# Patient Record
Sex: Female | Born: 1975 | Race: White | Hispanic: No | Marital: Married | Smoking: Never smoker
Health system: Southern US, Community
[De-identification: ages and names within clinical notes are randomized; demographics above are authoritative.]

## PROBLEM LIST (undated history)

## (undated) DIAGNOSIS — D696 Thrombocytopenia, unspecified: Secondary | ICD-10-CM

## (undated) DIAGNOSIS — R911 Solitary pulmonary nodule: Secondary | ICD-10-CM

## (undated) DIAGNOSIS — F419 Anxiety disorder, unspecified: Secondary | ICD-10-CM

## (undated) DIAGNOSIS — Z8616 Personal history of COVID-19: Secondary | ICD-10-CM

## (undated) DIAGNOSIS — F329 Major depressive disorder, single episode, unspecified: Secondary | ICD-10-CM

## (undated) DIAGNOSIS — J301 Allergic rhinitis due to pollen: Secondary | ICD-10-CM

## (undated) DIAGNOSIS — I4719 Other supraventricular tachycardia: Secondary | ICD-10-CM

## (undated) DIAGNOSIS — J45909 Unspecified asthma, uncomplicated: Secondary | ICD-10-CM

## (undated) DIAGNOSIS — F32A Depression, unspecified: Secondary | ICD-10-CM

## (undated) DIAGNOSIS — L309 Dermatitis, unspecified: Secondary | ICD-10-CM

## (undated) DIAGNOSIS — I471 Supraventricular tachycardia: Secondary | ICD-10-CM

## (undated) HISTORY — DX: Allergic rhinitis due to pollen: J30.1

## (undated) HISTORY — DX: Unspecified asthma, uncomplicated: J45.909

## (undated) HISTORY — DX: Depression, unspecified: F32.A

## (undated) HISTORY — DX: Supraventricular tachycardia: I47.1

## (undated) HISTORY — DX: Thrombocytopenia, unspecified: D69.6

## (undated) HISTORY — DX: Personal history of COVID-19: Z86.16

## (undated) HISTORY — PX: NO PAST SURGERIES: SHX2092

## (undated) HISTORY — DX: Dermatitis, unspecified: L30.9

## (undated) HISTORY — DX: Other supraventricular tachycardia: I47.19

## (undated) HISTORY — DX: Solitary pulmonary nodule: R91.1

---
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
## 1998-07-15 ENCOUNTER — Other Ambulatory Visit: Admission: RE | Admit: 1998-07-15 | Discharge: 1998-07-15 | Payer: Self-pay | Admitting: *Deleted

## 1999-08-26 ENCOUNTER — Other Ambulatory Visit: Admission: RE | Admit: 1999-08-26 | Discharge: 1999-08-26 | Payer: Self-pay | Admitting: Emergency Medicine

## 2000-10-10 ENCOUNTER — Other Ambulatory Visit: Admission: RE | Admit: 2000-10-10 | Discharge: 2000-10-10 | Payer: Self-pay | Admitting: Obstetrics and Gynecology

## 2001-10-09 ENCOUNTER — Other Ambulatory Visit: Admission: RE | Admit: 2001-10-09 | Discharge: 2001-10-09 | Payer: Self-pay | Admitting: Obstetrics and Gynecology

## 2002-10-16 ENCOUNTER — Other Ambulatory Visit: Admission: RE | Admit: 2002-10-16 | Discharge: 2002-10-16 | Payer: Self-pay | Admitting: Obstetrics and Gynecology

## 2002-10-19 ENCOUNTER — Encounter: Admission: RE | Admit: 2002-10-19 | Discharge: 2002-10-19 | Payer: Self-pay | Admitting: Obstetrics and Gynecology

## 2002-10-19 ENCOUNTER — Encounter: Payer: Self-pay | Admitting: Obstetrics and Gynecology

## 2004-02-12 ENCOUNTER — Other Ambulatory Visit: Admission: RE | Admit: 2004-02-12 | Discharge: 2004-02-12 | Payer: Self-pay | Admitting: Obstetrics and Gynecology

## 2004-02-26 ENCOUNTER — Ambulatory Visit: Payer: Self-pay | Admitting: Licensed Clinical Social Worker

## 2004-03-11 ENCOUNTER — Ambulatory Visit: Payer: Self-pay | Admitting: Licensed Clinical Social Worker

## 2004-03-18 ENCOUNTER — Ambulatory Visit: Payer: Self-pay | Admitting: Licensed Clinical Social Worker

## 2004-03-25 ENCOUNTER — Ambulatory Visit: Payer: Self-pay | Admitting: Licensed Clinical Social Worker

## 2004-03-30 ENCOUNTER — Ambulatory Visit: Payer: Self-pay | Admitting: Licensed Clinical Social Worker

## 2004-04-09 ENCOUNTER — Ambulatory Visit: Payer: Self-pay | Admitting: Licensed Clinical Social Worker

## 2005-04-07 ENCOUNTER — Other Ambulatory Visit: Admission: RE | Admit: 2005-04-07 | Discharge: 2005-04-07 | Payer: Self-pay | Admitting: Obstetrics and Gynecology

## 2006-07-22 ENCOUNTER — Inpatient Hospital Stay (HOSPITAL_COMMUNITY): Admission: AD | Admit: 2006-07-22 | Discharge: 2006-07-23 | Payer: Self-pay | Admitting: Obstetrics and Gynecology

## 2006-12-30 ENCOUNTER — Inpatient Hospital Stay (HOSPITAL_COMMUNITY): Admission: AD | Admit: 2006-12-30 | Discharge: 2006-12-30 | Payer: Self-pay | Admitting: Obstetrics and Gynecology

## 2007-01-03 ENCOUNTER — Inpatient Hospital Stay (HOSPITAL_COMMUNITY): Admission: AD | Admit: 2007-01-03 | Discharge: 2007-01-05 | Payer: Self-pay | Admitting: Obstetrics and Gynecology

## 2008-12-03 ENCOUNTER — Ambulatory Visit: Payer: Self-pay | Admitting: Oncology

## 2008-12-10 LAB — COMPREHENSIVE METABOLIC PANEL
ALT: 13 U/L (ref 0–35)
AST: 10 U/L (ref 0–37)
Albumin: 4.1 g/dL (ref 3.5–5.2)
Alkaline Phosphatase: 37 U/L — ABNORMAL LOW (ref 39–117)
BUN: 9 mg/dL (ref 6–23)
CO2: 23 mEq/L (ref 19–32)
Calcium: 9.1 mg/dL (ref 8.4–10.5)
Chloride: 106 mEq/L (ref 96–112)
Creatinine, Ser: 0.82 mg/dL (ref 0.40–1.20)
Glucose, Bld: 104 mg/dL — ABNORMAL HIGH (ref 70–99)
Potassium: 4 mEq/L (ref 3.5–5.3)
Sodium: 137 mEq/L (ref 135–145)
Total Bilirubin: 0.3 mg/dL (ref 0.3–1.2)
Total Protein: 6.6 g/dL (ref 6.0–8.3)

## 2008-12-10 LAB — CBC WITH DIFFERENTIAL/PLATELET
BASO%: 0.3 % (ref 0.0–2.0)
Basophils Absolute: 0 10*3/uL (ref 0.0–0.1)
EOS%: 0.6 % (ref 0.0–7.0)
Eosinophils Absolute: 0.1 10*3/uL (ref 0.0–0.5)
HCT: 36.5 % (ref 34.8–46.6)
HGB: 12.5 g/dL (ref 11.6–15.9)
LYMPH%: 16.7 % (ref 14.0–49.7)
MCH: 31.3 pg (ref 25.1–34.0)
MCHC: 34.3 g/dL (ref 31.5–36.0)
MCV: 91.2 fL (ref 79.5–101.0)
MONO#: 0.4 10*3/uL (ref 0.1–0.9)
MONO%: 3.9 % (ref 0.0–14.0)
NEUT#: 7.4 10*3/uL — ABNORMAL HIGH (ref 1.5–6.5)
NEUT%: 78.5 % — ABNORMAL HIGH (ref 38.4–76.8)
Platelets: 158 10*3/uL (ref 145–400)
RBC: 4 10*6/uL (ref 3.70–5.45)
RDW: 12.6 % (ref 11.2–14.5)
WBC: 9.4 10*3/uL (ref 3.9–10.3)
lymph#: 1.6 10*3/uL (ref 0.9–3.3)

## 2008-12-10 LAB — CHCC SMEAR

## 2008-12-10 LAB — LACTATE DEHYDROGENASE: LDH: 107 U/L (ref 94–250)

## 2009-07-27 ENCOUNTER — Inpatient Hospital Stay (HOSPITAL_COMMUNITY): Admission: AD | Admit: 2009-07-27 | Discharge: 2009-07-29 | Payer: Self-pay | Admitting: Obstetrics and Gynecology

## 2010-06-30 LAB — CBC
HCT: 30.5 % — ABNORMAL LOW (ref 36.0–46.0)
HCT: 39.2 % (ref 36.0–46.0)
Hemoglobin: 10.7 g/dL — ABNORMAL LOW (ref 12.0–15.0)
Hemoglobin: 13.4 g/dL (ref 12.0–15.0)
MCHC: 34.3 g/dL (ref 30.0–36.0)
MCHC: 35 g/dL (ref 30.0–36.0)
MCV: 96.4 fL (ref 78.0–100.0)
MCV: 97.1 fL (ref 78.0–100.0)
Platelets: 134 10*3/uL — ABNORMAL LOW (ref 150–400)
Platelets: 99 10*3/uL — ABNORMAL LOW (ref 150–400)
RBC: 3.15 MIL/uL — ABNORMAL LOW (ref 3.87–5.11)
RBC: 4.06 MIL/uL (ref 3.87–5.11)
RDW: 13.4 % (ref 11.5–15.5)
RDW: 13.4 % (ref 11.5–15.5)
WBC: 12.7 10*3/uL — ABNORMAL HIGH (ref 4.0–10.5)
WBC: 14.7 10*3/uL — ABNORMAL HIGH (ref 4.0–10.5)

## 2010-06-30 LAB — RPR: RPR Ser Ql: NONREACTIVE

## 2010-08-28 NOTE — H&P (Signed)
NAME:  Tracey Peterson, Tracey Peterson NO.:  [id number]   MEDICAL RECORD NO.:  [id number]          PATIENT TYPE:  INP   LOCATION:  9312                          FACILITY:  [facility]   PHYSICIAN:  Juluis Mire, M.D.   DATE OF BIRTH:  October 14, 1975   DATE OF ADMISSION:  07/22/2006  DATE OF DISCHARGE:                              HISTORY & PHYSICAL   The patient is a 35 year old primigravida female, estimated gestational  age of [redacted] weeks.  She was initially seen here at approximately 14 weeks  with bleeding.  Ultrasound revealed a subchorionic hemorrhage.  She  presented back today.  An ultrasound revealed that the subchorionic  hemorrhage has increased in size significantly.  Overall measurements  are approximately 15 x 2 cm.  Fetus is still viable.  There is normal  amniotic fluid.  In view of these findings, Dr. Vincente Poli decided to admit  the patient for observation.  Her blood type is O+.   IN TERMS OF ALLERGIES, SHE IS ALLERGIC TO PENICILLIN.   MEDICATION:  Prenatal vitamins.   For past medical history, family history, social history, please see  prenatal records.   REVIEW OF SYSTEMS:  Is noncontributory.   PHYSICAL EXAMINATION:  The patient is afebrile with stable vital signs.  LUNGS:  Clear.  CARDIOVASCULAR SYSTEM:  Regular rhythm and rate without murmurs or  gallops.  ABDOMINAL EXAM:  Uterus consistent with 16 weeks, mildly tender.  PELVIC:  Is deferred at the present time.  EXTREMITIES:  Trace edema.  NEUROLOGICAL EXAM:  Grossly within normal limits.   IMPRESSION:  Intrauterine pregnancy at 16 weeks with significant  subchorionic hemorrhage.   PLAN:  The patient will be brought in for observation.  Will recheck  fetal heart tones.  If remain stable, will discharge home for follow up  in the office.      Juluis Mire, M.D.  Electronically Signed     JSM/MEDQ  D:  07/22/2006  T:  07/22/2006  Job:  [id number]

## 2010-08-28 NOTE — Discharge Summary (Signed)
NAME:  Tracey Peterson, Tracey Peterson NO.:  [id number]   MEDICAL RECORD NO.:  [id number]          PATIENT TYPE:  INP   LOCATION:  9312                          FACILITY:  [facility]   PHYSICIAN:  Juluis Mire, M.D.   DATE OF BIRTH:  08-25-1975   DATE OF ADMISSION:  07/22/2006  DATE OF DISCHARGE:  07/23/2006                               DISCHARGE SUMMARY   ADMITTING DIAGNOSIS:  Intrauterine pregnancy at 16 weeks with  subchorionic hemorrhage.   DISCHARGE DIAGNOSIS:  Intrauterine pregnancy at 16 weeks with  subchorionic hemorrhage.   For complete history and physical please refer to the dictated note.   HOSPITAL COURSE:  The patient was observed overnight.  The following  morning she was having no active bleeding. Uterus nontender, fetal heart  tones were audible.  She was discharged home to continue rest at home.   In terms of complication there were none during her stay in the  hospital.  Patient was discharged home in stable condition.   DISPOSITION:  The patient is to report increase in bleeding or pain at  home. She is to follow-up in the office early next week. She is to be at  relative rest at home.      Juluis Mire, M.D.  Electronically Signed     JSM/MEDQ  D:  07/23/2006  T:  07/23/2006  Job:  [id number]

## 2011-01-21 LAB — CBC
HCT: 29.2 — ABNORMAL LOW
HCT: 37.4
Hemoglobin: 10.3 — ABNORMAL LOW
Hemoglobin: 13.1
MCHC: 35
MCHC: 35.3
MCV: 95.2
MCV: 95.6
Platelets: 106 — ABNORMAL LOW
Platelets: 132 — ABNORMAL LOW
RBC: 3.06 — ABNORMAL LOW
RBC: 3.93
RDW: 12.9
RDW: 13
WBC: 11.9 — ABNORMAL HIGH
WBC: 16.9 — ABNORMAL HIGH

## 2011-01-21 LAB — RPR: RPR Ser Ql: NONREACTIVE

## 2011-01-21 LAB — URINALYSIS, ROUTINE W REFLEX MICROSCOPIC
Bilirubin Urine: NEGATIVE
Glucose, UA: NEGATIVE
Ketones, ur: NEGATIVE
Nitrite: NEGATIVE
Protein, ur: NEGATIVE
Specific Gravity, Urine: 1.015
Urobilinogen, UA: 0.2
pH: 6

## 2011-01-21 LAB — CCBB MATERNAL DONOR DRAW

## 2011-01-21 LAB — URINE CULTURE: Colony Count: 85000

## 2011-01-21 LAB — URINE MICROSCOPIC-ADD ON

## 2011-01-21 LAB — RAPID HIV SCREEN (WH-MAU): Rapid HIV Screen: NONREACTIVE

## 2012-10-03 ENCOUNTER — Emergency Department (HOSPITAL_COMMUNITY): Payer: BC Managed Care – PPO

## 2012-10-03 ENCOUNTER — Emergency Department (HOSPITAL_COMMUNITY): Payer: BC Managed Care – PPO | Admitting: Anesthesiology

## 2012-10-03 ENCOUNTER — Encounter (HOSPITAL_COMMUNITY)
Admission: EM | Disposition: A | Discharge status: Home / Self Care | Payer: Self-pay | Source: Home / Self Care | Attending: Emergency Medicine

## 2012-10-03 ENCOUNTER — Encounter (HOSPITAL_COMMUNITY): Payer: Self-pay | Admitting: Anesthesiology

## 2012-10-03 ENCOUNTER — Observation Stay (HOSPITAL_COMMUNITY)
Admission: EM | Admit: 2012-10-03 | Discharge: 2012-10-05 | Disposition: A | Discharge status: Home / Self Care | Payer: BC Managed Care – PPO | Attending: General Surgery | Admitting: General Surgery

## 2012-10-03 ENCOUNTER — Encounter (HOSPITAL_COMMUNITY): Payer: Self-pay | Admitting: Emergency Medicine

## 2012-10-03 ENCOUNTER — Inpatient Hospital Stay: Admit: 2012-10-03 | Payer: Self-pay | Admitting: Surgery

## 2012-10-03 DIAGNOSIS — R42 Dizziness and giddiness: Secondary | ICD-10-CM | POA: Insufficient documentation

## 2012-10-03 DIAGNOSIS — R110 Nausea: Secondary | ICD-10-CM | POA: Insufficient documentation

## 2012-10-03 DIAGNOSIS — K37 Unspecified appendicitis: Secondary | ICD-10-CM | POA: Diagnosis present

## 2012-10-03 DIAGNOSIS — F411 Generalized anxiety disorder: Secondary | ICD-10-CM | POA: Insufficient documentation

## 2012-10-03 DIAGNOSIS — K429 Umbilical hernia without obstruction or gangrene: Secondary | ICD-10-CM | POA: Insufficient documentation

## 2012-10-03 DIAGNOSIS — K7689 Other specified diseases of liver: Secondary | ICD-10-CM | POA: Insufficient documentation

## 2012-10-03 DIAGNOSIS — K3580 Unspecified acute appendicitis: Secondary | ICD-10-CM

## 2012-10-03 DIAGNOSIS — R109 Unspecified abdominal pain: Secondary | ICD-10-CM | POA: Insufficient documentation

## 2012-10-03 DIAGNOSIS — Z79899 Other long term (current) drug therapy: Secondary | ICD-10-CM | POA: Insufficient documentation

## 2012-10-03 DIAGNOSIS — T50995A Adverse effect of other drugs, medicaments and biological substances, initial encounter: Secondary | ICD-10-CM | POA: Insufficient documentation

## 2012-10-03 HISTORY — DX: Anxiety disorder, unspecified: F41.9

## 2012-10-03 HISTORY — PX: LAPAROSCOPIC APPENDECTOMY: SHX408

## 2012-10-03 LAB — CBC WITH DIFFERENTIAL/PLATELET
Basophils Absolute: 0 10*3/uL (ref 0.0–0.1)
Basophils Relative: 0 % (ref 0–1)
Eosinophils Absolute: 0.1 10*3/uL (ref 0.0–0.7)
Eosinophils Relative: 0 % (ref 0–5)
HCT: 42.2 % (ref 36.0–46.0)
Hemoglobin: 14.2 g/dL (ref 12.0–15.0)
Lymphocytes Relative: 9 % — ABNORMAL LOW (ref 12–46)
Lymphs Abs: 1.5 10*3/uL (ref 0.7–4.0)
MCH: 30.7 pg (ref 26.0–34.0)
MCHC: 33.6 g/dL (ref 30.0–36.0)
MCV: 91.3 fL (ref 78.0–100.0)
Monocytes Absolute: 0.5 10*3/uL (ref 0.1–1.0)
Monocytes Relative: 3 % (ref 3–12)
Neutro Abs: 13.9 10*3/uL — ABNORMAL HIGH (ref 1.7–7.7)
Neutrophils Relative %: 87 % — ABNORMAL HIGH (ref 43–77)
Platelets: 168 10*3/uL (ref 150–400)
RBC: 4.62 MIL/uL (ref 3.87–5.11)
RDW: 12.3 % (ref 11.5–15.5)
WBC: 15.9 10*3/uL — ABNORMAL HIGH (ref 4.0–10.5)

## 2012-10-03 LAB — URINALYSIS, ROUTINE W REFLEX MICROSCOPIC
Bilirubin Urine: NEGATIVE
Glucose, UA: NEGATIVE mg/dL
Hgb urine dipstick: NEGATIVE
Ketones, ur: NEGATIVE mg/dL
Leukocytes, UA: NEGATIVE
Nitrite: NEGATIVE
Protein, ur: NEGATIVE mg/dL
Specific Gravity, Urine: 1.01 (ref 1.005–1.030)
Urobilinogen, UA: 0.2 mg/dL (ref 0.0–1.0)
pH: 6.5 (ref 5.0–8.0)

## 2012-10-03 LAB — BASIC METABOLIC PANEL
BUN: 9 mg/dL (ref 6–23)
CO2: 27 mEq/L (ref 19–32)
Calcium: 9.6 mg/dL (ref 8.4–10.5)
Chloride: 104 mEq/L (ref 96–112)
Creatinine, Ser: 0.92 mg/dL (ref 0.50–1.10)
GFR calc Af Amer: >90 mL/min (ref >90)
GFR calc non Af Amer: 80 mL/min — ABNORMAL LOW (ref >90)
Glucose, Bld: 101 mg/dL — ABNORMAL HIGH (ref 70–99)
Potassium: 3.8 mEq/L (ref 3.5–5.1)
Sodium: 141 mEq/L (ref 135–145)

## 2012-10-03 LAB — PREGNANCY, URINE: Preg Test, Ur: NEGATIVE

## 2012-10-03 SURGERY — APPENDECTOMY, LAPAROSCOPIC
Anesthesia: General | Site: Abdomen | Wound class: Clean Contaminated

## 2012-10-03 MED ORDER — NEOSTIGMINE METHYLSULFATE 1 MG/ML IJ SOLN
INTRAMUSCULAR | Status: DC | PRN
  Administered 2012-10-03: 3 mg via INTRAVENOUS

## 2012-10-03 MED ORDER — LEVOCETIRIZINE DIHYDROCHLORIDE 5 MG PO TABS: 5.0000 mg | ORAL_TABLET | Freq: Every evening | ORAL | Status: DC

## 2012-10-03 MED ORDER — METRONIDAZOLE IN NACL 5-0.79 MG/ML-% IV SOLN
INTRAVENOUS | Status: AC
  Filled 2012-10-03: qty 100

## 2012-10-03 MED ORDER — ONDANSETRON HCL 4 MG/2ML IJ SOLN
4.0000 mg | Freq: Four times a day (QID) | INTRAMUSCULAR | Status: DC | PRN
  Administered 2012-10-03: 4 mg via INTRAVENOUS
  Filled 2012-10-03: qty 2

## 2012-10-03 MED ORDER — MORPHINE SULFATE 2 MG/ML IJ SOLN
1.0000 mg | INTRAMUSCULAR | Status: DC | PRN
  Administered 2012-10-04 (×3): 1 mg via INTRAVENOUS
  Filled 2012-10-03 (×3): qty 1

## 2012-10-03 MED ORDER — FENTANYL CITRATE 0.05 MG/ML IJ SOLN
INTRAMUSCULAR | Status: DC | PRN
  Administered 2012-10-03 (×2): 50 ug via INTRAVENOUS

## 2012-10-03 MED ORDER — PROMETHAZINE HCL 25 MG/ML IJ SOLN: 6.2500 mg | INTRAMUSCULAR | Status: DC | PRN

## 2012-10-03 MED ORDER — HEPARIN SODIUM (PORCINE) 5000 UNIT/ML IJ SOLN
5000.0000 [IU] | Freq: Three times a day (TID) | INTRAMUSCULAR | Status: DC
  Administered 2012-10-04 – 2012-10-05 (×4): 5000 [IU] via SUBCUTANEOUS
  Filled 2012-10-03 (×7): qty 1

## 2012-10-03 MED ORDER — DESOGESTREL-ETHINYL ESTRADIOL 0.15-0.02/0.01 MG (21/5) PO TABS
1.0000 | ORAL_TABLET | Freq: Every day | ORAL | Status: DC
  Administered 2012-10-05: 1 via ORAL

## 2012-10-03 MED ORDER — MORPHINE SULFATE 4 MG/ML IJ SOLN
4.0000 mg | INTRAMUSCULAR | Status: DC | PRN
  Administered 2012-10-03: 4 mg via INTRAVENOUS
  Filled 2012-10-03: qty 1

## 2012-10-03 MED ORDER — CEFOTETAN DISODIUM 1 G IJ SOLR
1.0000 g | Freq: Two times a day (BID) | INTRAMUSCULAR | Status: DC
Start: 2012-10-03 — End: 2012-10-03

## 2012-10-03 MED ORDER — MIDAZOLAM HCL 5 MG/5ML IJ SOLN
INTRAMUSCULAR | Status: DC | PRN
  Administered 2012-10-03: 2 mg via INTRAVENOUS

## 2012-10-03 MED ORDER — KCL IN DEXTROSE-NACL 20-5-0.45 MEQ/L-%-% IV SOLN
INTRAVENOUS | Status: DC
  Administered 2012-10-03 – 2012-10-04 (×2): via INTRAVENOUS
  Filled 2012-10-03 (×4): qty 1000

## 2012-10-03 MED ORDER — METRONIDAZOLE IN NACL 5-0.79 MG/ML-% IV SOLN
500.0000 mg | INTRAVENOUS | Status: AC
  Administered 2012-10-03: 500 mg via INTRAVENOUS

## 2012-10-03 MED ORDER — ONDANSETRON HCL 4 MG/2ML IJ SOLN
4.0000 mg | Freq: Four times a day (QID) | INTRAMUSCULAR | Status: DC | PRN
  Administered 2012-10-04 – 2012-10-05 (×3): 4 mg via INTRAVENOUS
  Filled 2012-10-03 (×3): qty 2

## 2012-10-03 MED ORDER — MONTELUKAST SODIUM 10 MG PO TABS
10.0000 mg | ORAL_TABLET | Freq: Every day | ORAL | Status: DC
  Administered 2012-10-04: 10 mg via ORAL
  Filled 2012-10-03 (×3): qty 1

## 2012-10-03 MED ORDER — CEFAZOLIN SODIUM-DEXTROSE 2-3 GM-% IV SOLR
INTRAVENOUS | Status: AC
  Filled 2012-10-03: qty 50

## 2012-10-03 MED ORDER — DEXAMETHASONE SODIUM PHOSPHATE 10 MG/ML IJ SOLN
INTRAMUSCULAR | Status: DC | PRN
  Administered 2012-10-03: 10 mg via INTRAVENOUS

## 2012-10-03 MED ORDER — AZELASTINE HCL 0.1 % NA SOLN
1.0000 | Freq: Two times a day (BID) | NASAL | Status: DC
  Administered 2012-10-04 – 2012-10-05 (×2): 1 via NASAL
  Filled 2012-10-03: qty 30

## 2012-10-03 MED ORDER — HYDROMORPHONE HCL PF 1 MG/ML IJ SOLN: 0.2500 mg | INTRAMUSCULAR | Status: DC | PRN

## 2012-10-03 MED ORDER — ACETAMINOPHEN 10 MG/ML IV SOLN: 1000.0000 mg | Freq: Once | INTRAVENOUS | Status: DC | PRN

## 2012-10-03 MED ORDER — SODIUM CHLORIDE 0.9 % IV SOLN: Freq: Once | INTRAVENOUS | Status: DC

## 2012-10-03 MED ORDER — SODIUM CHLORIDE 0.9 % IV BOLUS (SEPSIS)
1000.0000 mL | Freq: Once | INTRAVENOUS | Status: AC
  Administered 2012-10-03: 1000 mL via INTRAVENOUS

## 2012-10-03 MED ORDER — BUPIVACAINE-EPINEPHRINE PF 0.25-1:200000 % IJ SOLN
INTRAMUSCULAR | Status: AC
  Filled 2012-10-03: qty 30

## 2012-10-03 MED ORDER — ONDANSETRON HCL 4 MG PO TABS: 4.0000 mg | ORAL_TABLET | Freq: Four times a day (QID) | ORAL | Status: DC | PRN

## 2012-10-03 MED ORDER — MEPERIDINE HCL 50 MG/ML IJ SOLN: 6.2500 mg | INTRAMUSCULAR | Status: DC | PRN

## 2012-10-03 MED ORDER — OXYCODONE HCL 5 MG/5ML PO SOLN
5.0000 mg | Freq: Once | ORAL | Status: DC | PRN
  Filled 2012-10-03: qty 5

## 2012-10-03 MED ORDER — CEFAZOLIN SODIUM-DEXTROSE 2-3 GM-% IV SOLR
2.0000 g | INTRAVENOUS | Status: AC
  Administered 2012-10-03: 2 g via INTRAVENOUS
  Filled 2012-10-03: qty 50

## 2012-10-03 MED ORDER — GLYCOPYRROLATE 0.2 MG/ML IJ SOLN
INTRAMUSCULAR | Status: DC | PRN
  Administered 2012-10-03: 0.4 mg via INTRAVENOUS

## 2012-10-03 MED ORDER — DIPHENHYDRAMINE HCL 50 MG/ML IJ SOLN: 25.0000 mg | Freq: Once | INTRAMUSCULAR | Status: DC

## 2012-10-03 MED ORDER — LACTATED RINGERS IV SOLN
INTRAVENOUS | Status: DC | PRN
  Administered 2012-10-03: 20:00:00 via INTRAVENOUS

## 2012-10-03 MED ORDER — ACETAMINOPHEN 325 MG PO TABS: 650.0000 mg | ORAL_TABLET | ORAL | Status: DC | PRN

## 2012-10-03 MED ORDER — IOHEXOL 300 MG/ML  SOLN
80.0000 mL | Freq: Once | INTRAMUSCULAR | Status: AC | PRN
  Administered 2012-10-03: 80 mL via INTRAVENOUS

## 2012-10-03 MED ORDER — CISATRACURIUM BESYLATE (PF) 10 MG/5ML IV SOLN
INTRAVENOUS | Status: DC | PRN
  Administered 2012-10-03: 4 mg via INTRAVENOUS

## 2012-10-03 MED ORDER — FLUTICASONE PROPIONATE 50 MCG/ACT NA SUSP
2.0000 | Freq: Every day | NASAL | Status: DC
  Administered 2012-10-05: 2 via NASAL
  Filled 2012-10-03: qty 16

## 2012-10-03 MED ORDER — CETIRIZINE HCL 10 MG PO TABS
10.0000 mg | ORAL_TABLET | Freq: Every day | ORAL | Status: DC
  Filled 2012-10-03 (×3): qty 1

## 2012-10-03 MED ORDER — BUPIVACAINE-EPINEPHRINE 0.25% -1:200000 IJ SOLN
INTRAMUSCULAR | Status: DC | PRN
  Administered 2012-10-03: 10 mL

## 2012-10-03 MED ORDER — ONDANSETRON HCL 4 MG/2ML IJ SOLN
INTRAMUSCULAR | Status: DC | PRN
  Administered 2012-10-03: 4 mg via INTRAVENOUS

## 2012-10-03 MED ORDER — LACTATED RINGERS IV SOLN
INTRAVENOUS | Status: DC | PRN
  Administered 2012-10-03: 250 mL via INTRAVENOUS
  Administered 2012-10-03: 22:00:00

## 2012-10-03 MED ORDER — HYDROCODONE-ACETAMINOPHEN 5-325 MG PO TABS
1.0000 | ORAL_TABLET | ORAL | Status: DC | PRN
  Administered 2012-10-03 – 2012-10-04 (×5): 2 via ORAL
  Filled 2012-10-03 (×5): qty 2

## 2012-10-03 MED ORDER — SUCCINYLCHOLINE CHLORIDE 20 MG/ML IJ SOLN
INTRAMUSCULAR | Status: DC | PRN
  Administered 2012-10-03: 80 mg via INTRAVENOUS

## 2012-10-03 MED ORDER — LACTATED RINGERS IV SOLN: INTRAVENOUS | Status: DC

## 2012-10-03 MED ORDER — MORPHINE SULFATE 2 MG/ML IJ SOLN
INTRAMUSCULAR | Status: AC
  Administered 2012-10-03: 1 mg via INTRAVENOUS
  Filled 2012-10-03: qty 1

## 2012-10-03 MED ORDER — PROPOFOL 10 MG/ML IV BOLUS
INTRAVENOUS | Status: DC | PRN
  Administered 2012-10-03: 20 mg via INTRAVENOUS
  Administered 2012-10-03: 130 mg via INTRAVENOUS

## 2012-10-03 MED ORDER — OXYCODONE HCL 5 MG PO TABS: 5.0000 mg | ORAL_TABLET | Freq: Once | ORAL | Status: DC | PRN

## 2012-10-03 MED ORDER — IOHEXOL 300 MG/ML  SOLN
50.0000 mL | Freq: Once | INTRAMUSCULAR | Status: AC | PRN
  Administered 2012-10-03: 50 mL via ORAL

## 2012-10-03 SURGICAL SUPPLY — 38 items
APPLIER CLIP ROT 10 11.4 M/L (STAPLE)
BENZOIN TINCTURE PRP APPL 2/3 (GAUZE/BANDAGES/DRESSINGS) ×2 IMPLANT
CANISTER SUCTION 2500CC (MISCELLANEOUS) ×2 IMPLANT
CLIP APPLIE ROT 10 11.4 M/L (STAPLE) IMPLANT
CLOTH BEACON ORANGE TIMEOUT ST (SAFETY) ×2 IMPLANT
COVER SURGICAL LIGHT HANDLE (MISCELLANEOUS) ×2 IMPLANT
CUTTER FLEX LINEAR 45M (STAPLE) ×2 IMPLANT
DECANTER SPIKE VIAL GLASS SM (MISCELLANEOUS) ×2 IMPLANT
DERMABOND ADVANCED (GAUZE/BANDAGES/DRESSINGS) ×1
DERMABOND ADVANCED .7 DNX12 (GAUZE/BANDAGES/DRESSINGS) ×1 IMPLANT
DRAPE LAPAROSCOPIC ABDOMINAL (DRAPES) ×2 IMPLANT
ELECT REM PT RETURN 9FT ADLT (ELECTROSURGICAL) ×2
ELECTRODE REM PT RTRN 9FT ADLT (ELECTROSURGICAL) ×1 IMPLANT
ENDOLOOP SUT PDS II  0 18 (SUTURE)
ENDOLOOP SUT PDS II 0 18 (SUTURE) IMPLANT
GLOVE BIOGEL M 8.0 STRL (GLOVE) ×2 IMPLANT
GLOVE BIOGEL PI IND STRL 7.0 (GLOVE) ×1 IMPLANT
GLOVE BIOGEL PI INDICATOR 7.0 (GLOVE) ×1
GOWN STRL NON-REIN LRG LVL3 (GOWN DISPOSABLE) ×2 IMPLANT
GOWN STRL REIN XL XLG (GOWN DISPOSABLE) ×4 IMPLANT
HAND ACTIVATED (MISCELLANEOUS) ×2 IMPLANT
KIT BASIN OR (CUSTOM PROCEDURE TRAY) ×2 IMPLANT
NS IRRIG 1000ML POUR BTL (IV SOLUTION) ×2 IMPLANT
PENCIL BUTTON HOLSTER BLD 10FT (ELECTRODE) IMPLANT
POUCH SPECIMEN RETRIEVAL 10MM (ENDOMECHANICALS) ×2 IMPLANT
RELOAD 45 VASCULAR/THIN (ENDOMECHANICALS) ×2 IMPLANT
RELOAD STAPLE TA45 3.5 REG BLU (ENDOMECHANICALS) IMPLANT
SET IRRIG TUBING LAPAROSCOPIC (IRRIGATION / IRRIGATOR) ×2 IMPLANT
SOLUTION ANTI FOG 6CC (MISCELLANEOUS) ×2 IMPLANT
STRIP CLOSURE SKIN 1/2X4 (GAUZE/BANDAGES/DRESSINGS) ×2 IMPLANT
SUT VIC AB 4-0 SH 18 (SUTURE) ×2 IMPLANT
SUT VICRYL 0 UR6 27IN ABS (SUTURE) ×2 IMPLANT
SYR 30ML LL (SYRINGE) ×2 IMPLANT
TRAY FOLEY CATH 14FRSI W/METER (CATHETERS) ×2 IMPLANT
TRAY LAP CHOLE (CUSTOM PROCEDURE TRAY) ×2 IMPLANT
TROCAR XCEL BLUNT TIP 100MML (ENDOMECHANICALS) ×2 IMPLANT
TROCAR XCEL NON-BLD 11X100MML (ENDOMECHANICALS) ×2 IMPLANT
TUBING INSUFFLATION 10FT LAP (TUBING) ×2 IMPLANT

## 2012-10-03 NOTE — Anesthesia Preprocedure Evaluation (Addendum)
Anesthesia Evaluation  Patient identified by MRN, date of birth, ID band Patient awake    Reviewed: Allergy & Precautions, H&P , NPO status , Patient's Chart, lab work & pertinent test results  Airway Mallampati: II TM Distance: >3 FB Neck ROM: Full    Dental  (+) Dental Advisory Given and Teeth Intact   Pulmonary neg pulmonary ROS,  breath sounds clear to auscultation        Cardiovascular negative cardio ROS  Rhythm:Regular Rate:Normal     Neuro/Psych PSYCHIATRIC DISORDERS Anxiety negative neurological ROS     GI/Hepatic negative GI ROS, Neg liver ROS,   Endo/Other  negative endocrine ROS  Renal/GU negative Renal ROS     Musculoskeletal negative musculoskeletal ROS (+)   Abdominal   Peds  Hematology negative hematology ROS (+)   Anesthesia Other Findings   Reproductive/Obstetrics negative OB ROS                          Anesthesia Physical Anesthesia Plan  ASA: I and emergent  Anesthesia Plan: General   Post-op Pain Management:    Induction: Intravenous and Rapid sequence  Airway Management Planned: Oral ETT  Additional Equipment:   Intra-op Plan:   Post-operative Plan: Extubation in OR  Informed Consent: I have reviewed the patients History and Physical, chart, labs and discussed the procedure including the risks, benefits and alternatives for the proposed anesthesia with the patient or authorized representative who has indicated his/her understanding and acceptance.   Dental advisory given  Plan Discussed with: CRNA  Anesthesia Plan Comments:         Anesthesia Quick Evaluation

## 2012-10-03 NOTE — Op Note (Signed)
Surgeon: Wenda Low, MD, FACS  Asst:  none  Anes:  Gen.  Procedure: Laparoscopic appendectomy  Diagnosis: Acute appendicitis  Complications: None noted  EBL:   minimal cc  Description of Procedure:  The patient was taken to room 1 and given general anesthesia. She was prepped with PCMX and draped sterilely. She had been given Ancef and Flagyl  Preoperatively.  A timeout was performed. The abdomen was accessed using Hassan technique through the umbilicus without difficulty. She did have a small umbilical hernia at the base of her appendix and this was enlarged to accommodate the [facility] trocar. 5 mm was placed obliquely in the right upper quadrant and 11 was placed in left lower quadrant obliquely through which the camera was placed. Upon surveying the abdomen she did have some blood in the pelvis suggesting possible mittelschmerz. The appendix was retrocecal and was engorged but not purulent. It was mobilized from the retroperitoneum with the harmonic scalpel and the base isolated. The stump was transected with the Endo GIA using a vascular load. The area was surveyed and no bleeding was noted. The incisions were injected with Marcaine. The umbilical defect was repaired under laparoscopic direct vision with 3 sutures of 0 Vicryl. Skin was approximated with 4-0 Vicryl and Dermabond. Patient was taken recovery in satisfactory condition.  Matt B. Daphine Deutscher, MD, [facility], [place] [phone]

## 2012-10-03 NOTE — ED Notes (Signed)
Pt reports lower abdominal since last night, c/o nausea, denies vomiting. Referred by PCP to rule out appendicitis

## 2012-10-03 NOTE — Transfer of Care (Signed)
Immediate Anesthesia Transfer of Care Note  Patient: Tracey Peterson  Procedure(s) Performed: Procedure(s): APPENDECTOMY LAPAROSCOPIC (N/A)  Patient Location: PACU  Anesthesia Type:General  Level of Consciousness: awake, alert , sedated and patient cooperative  Airway & Oxygen Therapy: Patient Spontanous Breathing and Patient connected to face mask oxygen  Post-op Assessment: Report given to PACU RN and Post -op Vital signs reviewed and stable  Post vital signs: Reviewed and stable  Complications: No apparent anesthesia complications

## 2012-10-03 NOTE — ED Provider Notes (Signed)
History    CSN: [id number] Arrival date & time 10/03/12  1649  First MD Initiated Contact with Patient 10/03/12 1658     Chief Complaint  Patient presents with  . Abdominal Pain    seen at [facility]. -R/o appendicitis. Pain x 20 hrs   (Consider location/radiation/quality/duration/timing/severity/associated sxs/prior Treatment) HPI Comments: Pt come in with cc of abd pain. Pt has no medical, surgical hx. States that she started having epigastric pain last night, that became periumbilical. The pain has been constant, and getting worse, so she went to her pcp - who sent her to the ED with concerns for appendicitis based on her exam. Pt has no pelvic disorders, no uti like sx and no vaginal discharge or bleeding. + nausea, anorexia.  Patient is a 37 y.o. female presenting with abdominal pain. The history is provided by the patient.  Abdominal Pain Associated symptoms include abdominal pain. Pertinent negatives include no chest pain, no headaches and no shortness of breath.   Past Medical History  Diagnosis Date  . Anxiety    History reviewed. No pertinent past surgical history. Family History  Problem Relation Age of Onset  . Cancer Mother   . Diabetes Other    History  Substance Use Topics  . Smoking status: Not on file  . Smokeless tobacco: Not on file  . Alcohol Use: Not on file   OB History   Grav Para Term Preterm Abortions TAB SAB Ect Mult Living                 Review of Systems  Constitutional: Positive for activity change.  HENT: Negative for neck pain.   Respiratory: Negative for shortness of breath.   Cardiovascular: Negative for chest pain.  Gastrointestinal: Positive for abdominal pain. Negative for nausea and vomiting.  Genitourinary: Negative for dysuria.  Neurological: Negative for headaches.    Allergies  Penicillins  Home Medications   Current Outpatient Rx  Name  Route  Sig  Dispense  Refill  . ALPRAZolam (XANAX) 0.5 MG tablet   Oral  Take 0.5 mg by mouth 2 (two) times daily as needed for anxiety.         Marland Kitchen azelastine (ASTELIN) 137 MCG/SPRAY nasal spray   Nasal   Place 1 spray into the nose 2 (two) times daily.         Marland Kitchen desogestrel-ethinyl estradiol (KARIVA,AZURETTE,MIRCETTE) 0.15-0.02/0.01 MG (21/5) tablet   Oral   Take 1 tablet by mouth daily.         Marland Kitchen doxycycline (VIBRA-TABS) 100 MG tablet   Oral   Take 100 mg by mouth 2 (two) times daily. 20 day course of therapy started 09/22/12.         . fluticasone (FLONASE) 50 MCG/ACT nasal spray   Nasal   Place 2 sprays into the nose daily.         Marland Kitchen ibuprofen (ADVIL,MOTRIN) 200 MG tablet   Oral   Take 600 mg by mouth every 8 (eight) hours as needed for pain.         Marland Kitchen levocetirizine (XYZAL) 5 MG tablet   Oral   Take 5 mg by mouth every evening.         . montelukast (SINGULAIR) 10 MG tablet   Oral   Take 10 mg by mouth at bedtime.         . Multiple Vitamin (MULTIVITAMIN WITH MINERALS) TABS   Oral   Take 1 tablet by mouth daily.         Marland Kitchen  pyridOXINE (VITAMIN B-6) 50 MG tablet   Oral   Take 50 mg by mouth daily.          BP 124/83  Pulse 65  Temp(Src) 98.4 F (36.9 C) (Oral)  Resp 18  Wt 125 lb (56.7 kg)  SpO2 100%  LMP 09/24/2012 Physical Exam  Nursing note and vitals reviewed. Constitutional: She is oriented to person, place, and time. She appears well-developed and well-nourished.  HENT:  Head: Normocephalic and atraumatic.  Eyes: EOM are normal. Pupils are equal, round, and reactive to light.  Neck: Neck supple.  Cardiovascular: Normal rate, regular rhythm and normal heart sounds.   No murmur heard. Pulmonary/Chest: Effort normal. No respiratory distress.  Abdominal: Soft. She exhibits no distension. There is tenderness. There is guarding. There is no rebound.  + mcburneys  Neurological: She is alert and oriented to person, place, and time.  Skin: Skin is warm and dry.    ED Course  Procedures (including critical  care time) Labs Reviewed  CBC WITH DIFFERENTIAL - Abnormal; Notable for the following:    WBC 15.9 (*)    Neutrophils Relative % 87 (*)    Neutro Abs 13.9 (*)    Lymphocytes Relative 9 (*)    All other components within normal limits  BASIC METABOLIC PANEL - Abnormal; Notable for the following:    Glucose, Bld 101 (*)    GFR calc non Af Amer 80 (*)    All other components within normal limits  PREGNANCY, URINE  URINALYSIS, ROUTINE W REFLEX MICROSCOPIC   No results found. No diagnosis found.  MDM  DDx includes:  SBO Tumors Colitis Intra abdominal abscess Thrombosis Mesenteric ischemia Appendicitis Nephrolithiasis Pyelonephritis UTI/Cystitis Ovarian cyst TOA Ectopic pregnancy PID  Pt comes in with cc of abd pain. Pt's hx and exam concerning for appendicitis. CT ordered - if negative for appy - will get a pelvic exam and possibly [place] as well. The reason Pelvic exam and workup is being deferred as her hx and exam is consistent with text book presentation for appendicitis.   7:09 PM CT confirms appy. Has leukocytosis. Surgery called. Penicillin allergy - not severe, will give cefotetan.          Derwood Kaplan, MD 10/03/12 1910

## 2012-10-03 NOTE — H&P (Signed)
Chief Complaint:  Abdominal pain since last night localizing into the right lower quadrant  History of Present Illness:  Tracey Peterson is an 37 y.o. female who was brought to the [facility] with RLQ pain and diminished appetite.  CT scan is suspicious for appendicitis.    Past Medical History  Diagnosis Date  . Anxiety     Past Surgical History  Procedure Laterality Date  . No past surgeries      Current Facility-Administered Medications  Medication Dose Route Frequency Provider Last Rate Last Dose  . 0.9 %  sodium chloride infusion   Intravenous Once Derwood Kaplan, MD      . ceFAZolin (ANCEF) IVPB 2 g/50 mL premix  2 g Intravenous 30 min Pre-Op Ankit Nanavati, MD      . diphenhydrAMINE (BENADRYL) injection 25 mg  25 mg Intravenous Once Ankit Nanavati, MD      . metroNIDAZOLE (FLAGYL) IVPB 500 mg  500 mg Intravenous 30 min Pre-Op Ankit Nanavati, MD      . morphine 4 MG/ML injection 4 mg  4 mg Intravenous Q2H PRN Derwood Kaplan, MD   4 mg at 10/03/12 1738  . ondansetron (ZOFRAN) injection 4 mg  4 mg Intravenous Q6H PRN Derwood Kaplan, MD   4 mg at 10/03/12 1738   Current Outpatient Prescriptions  Medication Sig Dispense Refill  . ALPRAZolam (XANAX) 0.5 MG tablet Take 0.5 mg by mouth 2 (two) times daily as needed for anxiety.      Marland Kitchen azelastine (ASTELIN) 137 MCG/SPRAY nasal spray Place 1 spray into the nose 2 (two) times daily.      Marland Kitchen desogestrel-ethinyl estradiol (KARIVA,AZURETTE,MIRCETTE) 0.15-0.02/0.01 MG (21/5) tablet Take 1 tablet by mouth daily.      Marland Kitchen doxycycline (VIBRA-TABS) 100 MG tablet Take 100 mg by mouth 2 (two) times daily. 20 day course of therapy started 09/22/12.      . fluticasone (FLONASE) 50 MCG/ACT nasal spray Place 2 sprays into the nose daily.      Marland Kitchen ibuprofen (ADVIL,MOTRIN) 200 MG tablet Take 600 mg by mouth every 8 (eight) hours as needed for pain.      Marland Kitchen levocetirizine (XYZAL) 5 MG tablet Take 5 mg by mouth every evening.      . montelukast (SINGULAIR) 10 MG tablet  Take 10 mg by mouth at bedtime.      . Multiple Vitamin (MULTIVITAMIN WITH MINERALS) TABS Take 1 tablet by mouth daily.      Marland Kitchen pyridOXINE (VITAMIN B-6) 50 MG tablet Take 50 mg by mouth daily.       Penicillins Family History  Problem Relation Age of Onset  . Cancer Mother   . Diabetes Other    Social History:   reports that she has never smoked. She has never used smokeless tobacco. She reports that  drinks alcohol. She reports that she does not use illicit drugs.   REVIEW OF SYSTEMS - PERTINENT POSITIVES ONLY: Allergies treated at [facility] allergy  Physical Exam:   Blood pressure 124/83, pulse 65, temperature 98.4 F (36.9 C), temperature source Oral, resp. rate 18, weight 125 lb (56.7 kg), last menstrual period 09/24/2012, SpO2 100.00%. There is no height on file to calculate BMI.  Gen:  WDWN WF NAD  Neurological: Alert and oriented to person, place, and time. Motor and sensory function is grossly intact  Head: Normocephalic and atraumatic.  Eyes: Conjunctivae are normal. Pupils are equal, round, and reactive to light. No scleral icterus.  Neck: Normal range of motion. Neck  supple. No tracheal deviation or thyromegaly present.  Cardiovascular:  SR without murmurs or gallops.  No carotid bruits Respiratory: Effort normal.  No respiratory distress. No chest wall tenderness. Breath sounds normal.  No wheezes, rales or rhonchi.  Abdomen:  Tender at McBurney's point GU: Musculoskeletal: Normal range of motion. Extremities are nontender. No cyanosis, edema or clubbing noted Lymphadenopathy: No cervical, preauricular, postauricular or axillary adenopathy is present Skin: Skin is warm and dry. No rash noted. No diaphoresis. No erythema. No pallor. Pscyh: Normal mood and affect. Behavior is normal. Judgment and thought content normal.   LABORATORY RESULTS: Results for orders placed during the hospital encounter of 10/03/12 (from the past 48 hour(s))  PREGNANCY, URINE     Status: None    Collection Time    10/03/12  5:19 PM      Result Value Range   Preg Test, Ur NEGATIVE  NEGATIVE   Comment:            THE SENSITIVITY OF THIS     METHODOLOGY IS >20 mIU/mL.  URINALYSIS, ROUTINE W REFLEX MICROSCOPIC     Status: None   Collection Time    10/03/12  5:19 PM      Result Value Range   Color, Urine YELLOW  YELLOW   APPearance CLEAR  CLEAR   Specific Gravity, Urine 1.010  1.005 - 1.030   pH 6.5  5.0 - 8.0   Glucose, UA NEGATIVE  NEGATIVE mg/dL   Hgb urine dipstick NEGATIVE  NEGATIVE   Bilirubin Urine NEGATIVE  NEGATIVE   Ketones, ur NEGATIVE  NEGATIVE mg/dL   Protein, ur NEGATIVE  NEGATIVE mg/dL   Urobilinogen, UA 0.2  0.0 - 1.0 mg/dL   Nitrite NEGATIVE  NEGATIVE   Leukocytes, UA NEGATIVE  NEGATIVE   Comment: MICROSCOPIC NOT DONE ON URINES WITH NEGATIVE PROTEIN, BLOOD, LEUKOCYTES, NITRITE, OR GLUCOSE <1000 mg/dL.  CBC WITH DIFFERENTIAL     Status: Abnormal   Collection Time    10/03/12  5:50 PM      Result Value Range   WBC 15.9 (*) 4.0 - 10.5 K/uL   RBC 4.62  3.87 - 5.11 MIL/uL   Hemoglobin 14.2  12.0 - 15.0 g/dL   HCT 95.6  21.3 - 08.6 %   MCV 91.3  78.0 - 100.0 fL   MCH 30.7  26.0 - 34.0 pg   MCHC 33.6  30.0 - 36.0 g/dL   RDW 57.8  46.9 - 62.9 %   Platelets 168  150 - 400 K/uL   Neutrophils Relative % 87 (*) 43 - 77 %   Neutro Abs 13.9 (*) 1.7 - 7.7 K/uL   Lymphocytes Relative 9 (*) 12 - 46 %   Lymphs Abs 1.5  0.7 - 4.0 K/uL   Monocytes Relative 3  3 - 12 %   Monocytes Absolute 0.5  0.1 - 1.0 K/uL   Eosinophils Relative 0  0 - 5 %   Eosinophils Absolute 0.1  0.0 - 0.7 K/uL   Basophils Relative 0  0 - 1 %   Basophils Absolute 0.0  0.0 - 0.1 K/uL  BASIC METABOLIC PANEL     Status: Abnormal   Collection Time    10/03/12  5:50 PM      Result Value Range   Sodium 141  135 - 145 mEq/L   Potassium 3.8  3.5 - 5.1 mEq/L   Chloride 104  96 - 112 mEq/L   CO2 27  19 - 32 mEq/L  Glucose, Bld 101 (*) 70 - 99 mg/dL   BUN 9  6 - 23 mg/dL   Creatinine, Ser 5.40   0.50 - 1.10 mg/dL   Calcium 9.6  8.4 - 98.1 mg/dL   GFR calc non Af Amer 80 (*) >90 mL/min   GFR calc Af Amer >90  >90 mL/min   Comment:            The eGFR has been calculated     using the CKD EPI equation.     This calculation has not been     validated in all clinical     situations.     eGFR's persistently     <90 mL/min signify     possible Chronic Kidney Disease.    RADIOLOGY RESULTS: Ct Abdomen Pelvis W Contrast  10/03/2012   *RADIOLOGY REPORT*  Clinical Data: Pain right lower quadrant.  Elevated white count. Question appendicitis.  CT ABDOMEN AND PELVIS WITH CONTRAST  Technique:  Multidetector CT imaging of the abdomen and pelvis was performed following the standard protocol during bolus administration of intravenous contrast.  Contrast: 50mL OMNIPAQUE IOHEXOL 300 MG/ML  SOLN, 80mL OMNIPAQUE IOHEXOL 300 MG/ML  SOLN  Comparison: None.  Findings: It is difficult to evaluate structures in the right pelvis secondary to the lack of fat planes, right ovarian cyst and portions of unopacified bowel however, findings are suspicious for appendicitis with appendix measuring up to 10.7 mm containing small appendicoliths.  No well-defined drainable abscess or free intraperitoneal air.  Lung bases clear.  Right liver 1.9 cm cyst without worrisome hepatic lesion. Intrahepatic biliary ducts top normal in size.  Liver is elongated spanning over 21.7 cm.  No calcified gallstones.  No worrisome splenic, pancreatic, renal or adrenal lesion.  No abdominal aortic aneurysm.  Calcified lymph node at the gastroesophageal junction.  Prominent vascularity of the uterus may be an incidental finding and greater on the left.  Similar findings described in patients with pelvic venous congestion syndrome.  Decompressed urinary bladder unremarkable.  No bony destructive lesion.  Transitional vertebra lumbosacral junction.  IMPRESSION: Findings suspicious for appendicitis as discussed above.  Elongated liver with 1.9 cm  cyst.  Prominent vascularity of the uterus may be an incidental finding and greater on the left.  Similar findings described in patients with pelvic venous congestion syndrome.  Critical Value/emergent results were called by telephone at the time of interpretation on 10/03/2012 at 6:50 p.m. to Dr.Nanavati, who verbally acknowledged these results.   Original Report Authenticated By: Lacy Duverney, M.D.    Problem List: Patient Active Problem List   Diagnosis Date Noted  . Appendicitis 10/03/2012    Assessment & Plan: Acute appendicitis by CT and clinically.  I have explained lap and open appendectomy to her and her husband.  Will proceed to OR.      Matt B. Daphine Deutscher, MD, [facility] [phone] beeper [phone]  10/03/2012 7:30 PM

## 2012-10-03 NOTE — Anesthesia Postprocedure Evaluation (Signed)
Anesthesia Post Note  Patient: Tracey Peterson  Procedure(s) Performed: Procedure(s) (LRB): APPENDECTOMY LAPAROSCOPIC (N/A)  Anesthesia type: General  Patient location: PACU  Post pain: Pain level controlled  Post assessment: Post-op Vital signs reviewed  Last Vitals: BP 110/69  Pulse 59  Temp(Src) 36.8 C (Oral)  Resp 13  Wt 125 lb (56.7 kg)  SpO2 100%  LMP 09/24/2012  Post vital signs: Reviewed  Level of consciousness: sedated  Complications: No apparent anesthesia complications

## 2012-10-04 ENCOUNTER — Encounter (HOSPITAL_COMMUNITY): Payer: Self-pay | Admitting: Surgery

## 2012-10-04 LAB — CBC
HCT: 31.8 % — ABNORMAL LOW (ref 36.0–46.0)
Hemoglobin: 10.6 g/dL — ABNORMAL LOW (ref 12.0–15.0)
MCH: 30.3 pg (ref 26.0–34.0)
MCHC: 33.3 g/dL (ref 30.0–36.0)
MCV: 90.9 fL (ref 78.0–100.0)
Platelets: 124 10*3/uL — ABNORMAL LOW (ref 150–400)
RBC: 3.5 MIL/uL — ABNORMAL LOW (ref 3.87–5.11)
RDW: 12.4 % (ref 11.5–15.5)
WBC: 15.1 10*3/uL — ABNORMAL HIGH (ref 4.0–10.5)

## 2012-10-04 MED ORDER — TRAMADOL HCL 50 MG PO TABS
50.0000 mg | ORAL_TABLET | Freq: Four times a day (QID) | ORAL | Status: DC | PRN
  Administered 2012-10-04 – 2012-10-05 (×3): 50 mg via ORAL
  Filled 2012-10-04 (×3): qty 1

## 2012-10-04 MED ORDER — KETOROLAC TROMETHAMINE 15 MG/ML IJ SOLN
15.0000 mg | Freq: Four times a day (QID) | INTRAMUSCULAR | Status: DC
  Administered 2012-10-04 – 2012-10-05 (×4): 15 mg via INTRAVENOUS
  Filled 2012-10-04 (×7): qty 1

## 2012-10-04 NOTE — Progress Notes (Signed)
Patient ID: Tracey Peterson, female   DOB: 1975/04/18, 37 y.o.   MRN: [id number] 1 Day Post-Op  Subjective: Pt just had sharp pain.  When she sat up she got dizzy and nauseated.  Ate some oatmeal for breakfast and did ok  Objective: Vital signs in last 24 hours: Temp:  [97.9 F (36.6 C)-98.9 F (37.2 C)] 98.4 F (36.9 C) (06/25 0959) Pulse Rate:  [58-78] 68 (06/25 0959) Resp:  [12-18] 16 (06/25 0959) BP: (88-124)/(55-94) 88/55 mmHg (06/25 0959) SpO2:  [98 %-100 %] 98 % (06/25 0959) Weight:  [125 lb (56.7 kg)] 125 lb (56.7 kg) (06/24 2245)    Intake/Output from previous day: 06/24 0701 - 06/25 0700 In: 2022.5 [P.O.:360; I.V.:1662.5] Out: 1050 [Urine:1050] Intake/Output this shift:    PE: Abd: soft, appropriately tender, +BS, ND, incisions c/d/i  Lab Results:   Recent Labs  10/03/12 1750 10/04/12 0413  WBC 15.9* 15.1*  HGB 14.2 10.6*  HCT 42.2 31.8*  PLT 168 124*   BMET  Recent Labs  10/03/12 1750  NA 141  K 3.8  CL 104  CO2 27  GLUCOSE 101*  BUN 9  CREATININE 0.92  CALCIUM 9.6   PT/INR No results found for this basename: LABPROT, INR,  in the last 72 hours CMP     Component Value Date/Time   NA 141 10/03/2012 1750   K 3.8 10/03/2012 1750   CL 104 10/03/2012 1750   CO2 27 10/03/2012 1750   GLUCOSE 101* 10/03/2012 1750   BUN 9 10/03/2012 1750   CREATININE 0.92 10/03/2012 1750   CALCIUM 9.6 10/03/2012 1750   PROT 6.6 12/10/2008 1353   ALBUMIN 4.1 12/10/2008 1353   AST 10 12/10/2008 1353   ALT 13 12/10/2008 1353   ALKPHOS 37* 12/10/2008 1353   BILITOT 0.3 12/10/2008 1353   GFRNONAA 80* 10/03/2012 1750   GFRAA >90 10/03/2012 1750   Lipase  No results found for this basename: lipase       Studies/Results: Ct Abdomen Pelvis W Contrast  10/03/2012   *RADIOLOGY REPORT*  Clinical Data: Pain right lower quadrant.  Elevated white count. Question appendicitis.  CT ABDOMEN AND PELVIS WITH CONTRAST  Technique:  Multidetector CT imaging of the abdomen and pelvis was  performed following the standard protocol during bolus administration of intravenous contrast.  Contrast: 50mL OMNIPAQUE IOHEXOL 300 MG/ML  SOLN, 80mL OMNIPAQUE IOHEXOL 300 MG/ML  SOLN  Comparison: None.  Findings: It is difficult to evaluate structures in the right pelvis secondary to the lack of fat planes, right ovarian cyst and portions of unopacified bowel however, findings are suspicious for appendicitis with appendix measuring up to 10.7 mm containing small appendicoliths.  No well-defined drainable abscess or free intraperitoneal air.  Lung bases clear.  Right liver 1.9 cm cyst without worrisome hepatic lesion. Intrahepatic biliary ducts top normal in size.  Liver is elongated spanning over 21.7 cm.  No calcified gallstones.  No worrisome splenic, pancreatic, renal or adrenal lesion.  No abdominal aortic aneurysm.  Calcified lymph node at the gastroesophageal junction.  Prominent vascularity of the uterus may be an incidental finding and greater on the left.  Similar findings described in patients with pelvic venous congestion syndrome.  Decompressed urinary bladder unremarkable.  No bony destructive lesion.  Transitional vertebra lumbosacral junction.  IMPRESSION: Findings suspicious for appendicitis as discussed above.  Elongated liver with 1.9 cm cyst.  Prominent vascularity of the uterus may be an incidental finding and greater on the left.  Similar findings  described in patients with pelvic venous congestion syndrome.  Critical Value/emergent results were called by telephone at the time of interpretation on 10/03/2012 at 6:50 p.m. to Dr.Nanavati, who verbally acknowledged these results.   Original Report Authenticated By: Lacy Duverney, M.D.    Anti-infectives: Anti-infectives   Start     Dose/Rate Route Frequency Ordered Stop   10/03/12 2200  cefoTEtan (CEFOTAN) 1 g in dextrose 5 % 50 mL IVPB  Status:  Discontinued     1 g 100 mL/hr over 30 Minutes Intravenous Every 12 hours 10/03/12 1906  10/03/12 1914   10/03/12 1915  [MAR Hold]  metroNIDAZOLE (FLAGYL) IVPB 500 mg     (On MAR Hold since 10/03/12 1954)   500 mg 100 mL/hr over 60 Minutes Intravenous 30 min pre-op 10/03/12 1917 10/03/12 2017   10/03/12 1915  [MAR Hold]  ceFAZolin (ANCEF) IVPB 2 g/50 mL premix     (On MAR Hold since 10/03/12 1954)   2 g 100 mL/hr over 30 Minutes Intravenous 30 min pre-op 10/03/12 1917 10/03/12 2003       Assessment/Plan  1. S/p lap appy  Plan: 1. Patient not quite ready to go home yet.  Will allow her to mobilize and get better pain control prior to dc home.   LOS: 1 day    Shenicka Sunderlin E 10/04/2012, 10:49 AM Pager: [phone]

## 2012-10-04 NOTE — Progress Notes (Signed)
Pt s/p appy. Still with dizziness, nausea, and pain.  Will reeval in afternoon to see if able to be discharged.

## 2012-10-05 DIAGNOSIS — F419 Anxiety disorder, unspecified: Secondary | ICD-10-CM | POA: Insufficient documentation

## 2012-10-05 MED ORDER — ONDANSETRON HCL 4 MG PO TABS: 4.0000 mg | ORAL_TABLET | Freq: Four times a day (QID) | ORAL | Status: DC | PRN

## 2012-10-05 MED ORDER — TRAMADOL HCL 50 MG PO TABS: 50.0000 mg | ORAL_TABLET | Freq: Four times a day (QID) | ORAL | Status: DC | PRN

## 2012-10-05 NOTE — Progress Notes (Signed)
Discharge summary sent to payer through [facility]  

## 2012-10-05 NOTE — Progress Notes (Signed)
Pt to d/c home. AVS reviewed and "My Chart" discussed with pt. Pt capable of verbalizing medications and follow-up appointments. Remains hemodynamically stable. No signs and symptoms of distress. Educated pt to return to ER in the case of SOB, dizziness, or chest pain.  

## 2012-10-05 NOTE — Discharge Summary (Signed)
Patient ID: Tracey Peterson MRN: [id number] DOB/AGE: [birth year] 37 y.o.  Admit date: 10/03/2012 Discharge date: 10/05/2012  Procedures: laparoscopic appendectomy  Consults: None  Reason for Admission: Tracey Peterson is an 37 y.o. female who was brought to the [facility] with RLQ pain and diminished appetite. CT scan is suspicious for appendicitis.   Admission Diagnoses:  1. Acute appendicitis 2. anxiety  Hospital Course: the patient was admitted and taken to the OR for a lap appy.  She tolerated the procedure well, but on POD# 1 was having some nausea issues secondary to her pain medication.  She was transitioned to [facility] and this ceased.  On POD# 2, her pain was much better controlled and her nausea had resolved.  She was stable for dc home.  PE: Abd: soft, much less tender, +BS, ND, incisions c/d/i  Discharge Diagnoses:  Active Problems:   Appendicitis s/p lap appy 10/03/2012   Discharge Medications:   Medication List    TAKE these medications       ALPRAZolam 0.5 MG tablet  Commonly known as:  XANAX  Take 0.5 mg by mouth 2 (two) times daily as needed for anxiety.     azelastine 137 MCG/SPRAY nasal spray  Commonly known as:  ASTELIN  Place 1 spray into the nose 2 (two) times daily.     desogestrel-ethinyl estradiol 0.15-0.02/0.01 MG (21/5) tablet  Commonly known as:  KARIVA,AZURETTE,MIRCETTE  Take 1 tablet by mouth daily.     doxycycline 100 MG tablet  Commonly known as:  VIBRA-TABS  Take 100 mg by mouth 2 (two) times daily. 20 day course of therapy started 09/22/12.     fluticasone 50 MCG/ACT nasal spray  Commonly known as:  FLONASE  Place 2 sprays into the nose daily.     ibuprofen 200 MG tablet  Commonly known as:  ADVIL,MOTRIN  Take 600 mg by mouth every 8 (eight) hours as needed for pain.     levocetirizine 5 MG tablet  Commonly known as:  XYZAL  Take 5 mg by mouth every evening.     montelukast 10 MG tablet  Commonly known as:  SINGULAIR  Take 10 mg by  mouth at bedtime.     multivitamin with minerals Tabs  Take 1 tablet by mouth daily.     ondansetron 4 MG tablet  Commonly known as:  ZOFRAN  Take 1 tablet (4 mg total) by mouth every 6 (six) hours as needed for nausea.     pyridOXINE 50 MG tablet  Commonly known as:  VITAMIN B-6  Take 50 mg by mouth daily.     traMADol 50 MG tablet  Commonly known as:  ULTRAM  Take 1 tablet (50 mg total) by mouth every 6 (six) hours as needed.        Discharge Instructions:     Follow-up Information   Follow up with [facility].   Contact information:   [address]   [place] [address] [phone]       Signed: Letha Cape 10/05/2012, 9:45 AM

## 2012-10-10 ENCOUNTER — Encounter (HOSPITAL_COMMUNITY): Payer: Self-pay | Admitting: Emergency Medicine

## 2012-10-10 ENCOUNTER — Emergency Department (HOSPITAL_COMMUNITY): Payer: BC Managed Care – PPO

## 2012-10-10 ENCOUNTER — Emergency Department (HOSPITAL_COMMUNITY)
Admission: EM | Admit: 2012-10-10 | Discharge: 2012-10-10 | Disposition: A | Discharge status: Home / Self Care | Payer: BC Managed Care – PPO | Attending: Emergency Medicine | Admitting: Emergency Medicine

## 2012-10-10 DIAGNOSIS — R079 Chest pain, unspecified: Secondary | ICD-10-CM | POA: Insufficient documentation

## 2012-10-10 DIAGNOSIS — IMO0002 Reserved for concepts with insufficient information to code with codable children: Secondary | ICD-10-CM | POA: Insufficient documentation

## 2012-10-10 DIAGNOSIS — F411 Generalized anxiety disorder: Secondary | ICD-10-CM | POA: Insufficient documentation

## 2012-10-10 DIAGNOSIS — R911 Solitary pulmonary nodule: Secondary | ICD-10-CM | POA: Insufficient documentation

## 2012-10-10 DIAGNOSIS — R0789 Other chest pain: Secondary | ICD-10-CM

## 2012-10-10 DIAGNOSIS — R071 Chest pain on breathing: Secondary | ICD-10-CM | POA: Insufficient documentation

## 2012-10-10 DIAGNOSIS — Z79899 Other long term (current) drug therapy: Secondary | ICD-10-CM | POA: Insufficient documentation

## 2012-10-10 DIAGNOSIS — Z880 Allergy status to penicillin: Secondary | ICD-10-CM | POA: Insufficient documentation

## 2012-10-10 DIAGNOSIS — R110 Nausea: Secondary | ICD-10-CM | POA: Insufficient documentation

## 2012-10-10 LAB — D-DIMER, QUANTITATIVE: D-Dimer, Quant: 0.41 ug/mL-FEU (ref 0.00–0.48)

## 2012-10-10 MED ORDER — ACETAMINOPHEN 500 MG PO TABS: 500.0000 mg | ORAL_TABLET | Freq: Four times a day (QID) | ORAL | Status: DC | PRN

## 2012-10-10 NOTE — ED Provider Notes (Signed)
History    CSN: [id number] Arrival date & time 10/10/12  1627  First MD Initiated Contact with Patient 10/10/12 1640     Chief Complaint  Patient presents with  . Shortness of Breath   (Consider location/radiation/quality/duration/timing/severity/associated sxs/prior Treatment) HPI Pt is a 37yo female who is 1 week post-op from appendectomy performed on 10/03/12 by Dr. Daphine Deutscher.  Pt c/o occasional right lower chest pain with deep inspiration that is 9/10, but only occurs with deep inspiration.  Pt states she notices herself breathing more shallow, then will take a deep breath, however this triggers a sharp stabbing pain in right lower lungs.  Pt also reports 5 days between surgery and her last BM.  States she is not becoming "more regular" but still having the right sided chest pain.  Reports some initial nausea with pain medications however that has resolved after switching to tylenol and ibuprofen.  Denies fever.  Denies any other significant PMH. Denies any abdominal pain at this time. States incision sites are healing well.   Past Medical History  Diagnosis Date  . Anxiety    Past Surgical History  Procedure Laterality Date  . No past surgeries    . Laparoscopic appendectomy N/A 10/03/2012    Procedure: APPENDECTOMY LAPAROSCOPIC;  Surgeon: Valarie Merino, MD;  Location: [facility]S;  Service: General;  Laterality: N/A;   Family History  Problem Relation Age of Onset  . Cancer Mother   . Diabetes Other    History  Substance Use Topics  . Smoking status: Never Smoker   . Smokeless tobacco: Never Used  . Alcohol Use: Yes     Comment: 3 glasses of wine per week   OB History   Grav Para Term Preterm Abortions TAB SAB Ect Mult Living                 Review of Systems  Constitutional: Negative for fever, chills, diaphoresis and fatigue.  Respiratory: Positive for shortness of breath. Negative for cough, choking and chest tightness.   Cardiovascular: Positive for chest pain.  Negative for palpitations.  Gastrointestinal: Positive for nausea. Negative for vomiting, abdominal pain and diarrhea.  All other systems reviewed and are negative.    Allergies  Penicillins  Home Medications   Current Outpatient Rx  Name  Route  Sig  Dispense  Refill  . ALPRAZolam (XANAX) 0.5 MG tablet   Oral   Take 0.5 mg by mouth 2 (two) times daily as needed for anxiety.         Marland Kitchen azelastine (ASTELIN) 137 MCG/SPRAY nasal spray   Nasal   Place 1 spray into the nose 2 (two) times daily.         Marland Kitchen desogestrel-ethinyl estradiol (KARIVA,AZURETTE,MIRCETTE) 0.15-0.02/0.01 MG (21/5) tablet   Oral   Take 1 tablet by mouth daily.         . fluticasone (FLONASE) 50 MCG/ACT nasal spray   Nasal   Place 2 sprays into the nose daily.         Marland Kitchen ibuprofen (ADVIL,MOTRIN) 200 MG tablet   Oral   Take 600 mg by mouth every 8 (eight) hours as needed for pain.         Marland Kitchen levocetirizine (XYZAL) 5 MG tablet   Oral   Take 5 mg by mouth every evening.         . montelukast (SINGULAIR) 10 MG tablet   Oral   Take 10 mg by mouth at bedtime.         Marland Kitchen  Multiple Vitamin (MULTIVITAMIN WITH MINERALS) TABS   Oral   Take 1 tablet by mouth daily.         Marland Kitchen pyridOXINE (VITAMIN B-6) 50 MG tablet   Oral   Take 50 mg by mouth daily.         Marland Kitchen acetaminophen (TYLENOL) 500 MG tablet   Oral   Take 1 tablet (500 mg total) by mouth every 6 (six) hours as needed for pain.   30 tablet   0   . doxycycline (VIBRA-TABS) 100 MG tablet   Oral   Take 100 mg by mouth 2 (two) times daily. 20 day course of therapy started 09/20/12.         Marland Kitchen ondansetron (ZOFRAN) 4 MG tablet   Oral   Take 1 tablet (4 mg total) by mouth every 6 (six) hours as needed for nausea.   20 tablet   0    BP 127/78  Pulse 83  Temp(Src) 98.4 F (36.9 C) (Oral)  Resp 18  SpO2 100%  LMP 09/24/2012 Physical Exam  Nursing note and vitals reviewed. Constitutional: She appears well-developed and well-nourished.  No distress.  Pt comfortably lying upright in exam bed. NAD.  HENT:  Head: Normocephalic and atraumatic.  Eyes: Conjunctivae are normal. No scleral icterus.  Neck: Normal range of motion. Neck supple.  Cardiovascular: Normal rate, regular rhythm and normal heart sounds.   Pulmonary/Chest: Effort normal and breath sounds normal. No respiratory distress. She has no wheezes. She has no rales. She exhibits no tenderness.  CTAB, no TTP of chest wall, no respiratory distress, able to speak in full sentences.   Abdominal: Soft. Bowel sounds are normal. She exhibits no distension and no mass. There is no tenderness. There is no rebound and no guarding.  3 well healing incision sites on abdomen.  No erythema or warmth. Abd soft, NDNT.  Musculoskeletal: Normal range of motion.  Neurological: She is alert.  Skin: Skin is warm and dry. She is not diaphoretic.    ED Course  Procedures (including critical care time) Labs Reviewed  D-DIMER, QUANTITATIVE   Dg Chest 2 View  10/10/2012   *RADIOLOGY REPORT*  Clinical Data: Chest pain, shortness of breath  CHEST - 2 VIEW  Comparison: None.  Findings: The cardiac and mediastinal silhouettes are within normal limits.  The lungs are mildly hyperinflated.  There is no airspace consolidation, pleural effusion, or pulmonary edema. There is no pneumothorax.  A 5 mm well circumscribed nodular opacity is present at the right lung base which may represent a pulmonary nodule or a vessel on end.  No acute osseous abnormality.  IMPRESSION: 1.  No acute cardiopulmonary process. 2.  5 mm right lower lobe nodular opacity. Likely calcified granuloma. If available, comparison with prior studies to assess chronicity recommended.   Original Report Authenticated By: Rise Mu, M.D.   1. Right-sided chest wall pain   2. Nodule of right lung     MDM  Pt c/o intermittent right sided chest pain with deep inspiration 1 week after 2 day hospital stay for appendectomy.  Pt is  stable, no tachycardia, tachypnea, nl pressure. No respiratory distress. Risk for PE low according to Well's criteria.  Pain possibly due to early pneumonia, but most likely due to atelectasis, MSK.  Will obtain CXR.   CXR: no acute cardiopulmonary process.  5mm right lower lobe nodular opacity. Likely calcified granuloma.  Pt will need f/u CXR to follow any progression of nodule as there are no  previous CXR on file for comparison.   Pt states she is not a smoker and is relatively healthy.    D-dimer was obtained to ensure no PE: d-dimer was WNL   Will discharge pt home and have her f/u with her PCP, Dr. Johnette Abraham to have repeat CXR in 3-84mo to monitor for any change in nodule. Pt verbalized understanding and agreement with tx plan. Vitals: unremarkable. Discharged in stable condition.    Discussed pt with attending during ED encounter.     Junius Finner, PA-C 10/10/12 1916

## 2012-10-10 NOTE — Progress Notes (Signed)
   CARE MANAGEMENT ED NOTE 10/10/2012  Patient:  Tracey Peterson, Tracey Peterson   Account Number:  [id number]  Date Initiated:  10/10/2012  Documentation initiated by:  Radford Pax  Subjective/Objective Assessment:   Patient presents to ED with SOB.     Subjective/Objective Assessment Detail:     Action/Plan:   Action/Plan Detail:   Anticipated DC Date:  10/10/2012     Status Recommendation to Physician:   Result of Recommendation:    Other ED Services  Consult Working Plan    DC Planning Services  Other  PCP issues    Choice offered to / List presented to:            Status of service:  Completed, signed off  ED Comments:   ED Comments Detail:  Patient listed as not having a pcp.  Patient reports her pcp is Dr. Carilyn Goodpasture.

## 2012-10-10 NOTE — ED Provider Notes (Signed)
Medical screening examination/treatment/procedure(s) were conducted as a shared visit with non-physician practitioner(s) and myself.  I personally evaluated the patient during the encounter  Toy Baker, MD 10/10/12 2222

## 2012-10-10 NOTE — ED Notes (Signed)
PA at bedside.

## 2012-10-10 NOTE — ED Notes (Signed)
Pt states that she had an appendectomy last week.  Began having SOB on Sunday.  Denies CP.  States that she was sent here to rule out a PE.

## 2012-10-24 ENCOUNTER — Encounter (INDEPENDENT_AMBULATORY_CARE_PROVIDER_SITE_OTHER): Payer: Self-pay

## 2012-10-24 ENCOUNTER — Ambulatory Visit (INDEPENDENT_AMBULATORY_CARE_PROVIDER_SITE_OTHER): Payer: BC Managed Care – PPO | Admitting: Internal Medicine

## 2012-10-24 VITALS — BP 108/74 | HR 64 | Temp 97.6°F | Resp 12 | Ht 62.0 in | Wt 122.4 lb

## 2012-10-24 DIAGNOSIS — K3580 Unspecified acute appendicitis: Secondary | ICD-10-CM

## 2012-10-24 NOTE — Progress Notes (Signed)
  Subjective: Pt returns to the clinic today after undergoing laparoscopic appendectomy on 10/03/12 by Dr. Daphine Deutscher.  The patient is tolerating their diet well and is having no severe pain.  Bowel function is good.  No problems with the wounds.  Objective: Vital signs in last 24 hours: Reviewed  PE: Abd: soft, non-tender, +bs, incisions well healed  Lab Results:  No results found for this basename: WBC, HGB, HCT, PLT,  in the last 72 hours BMET No results found for this basename: NA, K, CL, CO2, GLUCOSE, BUN, CREATININE, CALCIUM,  in the last 72 hours PT/INR No results found for this basename: LABPROT, INR,  in the last 72 hours CMP     Component Value Date/Time   NA 141 10/03/2012 1750   K 3.8 10/03/2012 1750   CL 104 10/03/2012 1750   CO2 27 10/03/2012 1750   GLUCOSE 101* 10/03/2012 1750   BUN 9 10/03/2012 1750   CREATININE 0.92 10/03/2012 1750   CALCIUM 9.6 10/03/2012 1750   PROT 6.6 12/10/2008 1353   ALBUMIN 4.1 12/10/2008 1353   AST 10 12/10/2008 1353   ALT 13 12/10/2008 1353   ALKPHOS 37* 12/10/2008 1353   BILITOT 0.3 12/10/2008 1353   GFRNONAA 80* 10/03/2012 1750   GFRAA >90 10/03/2012 1750   Lipase  No results found for this basename: lipase       Studies/Results: No results found.  Anti-infectives: Anti-infectives   None       Assessment/Plan  1.  S/P Laparoscopic Appendectomy: doing well, may resume regular activity without restrictions, Pt will follow up with [place] PR[facility] and knows to call with questions or concerns.     Shyam Dawson 10/24/2012

## 2012-10-24 NOTE — Patient Instructions (Signed)
May resume regular activity without restrictions. Follow up as needed. Call with questions or concerns.  

## 2013-02-07 ENCOUNTER — Other Ambulatory Visit: Payer: Self-pay | Admitting: Family Medicine

## 2013-02-07 ENCOUNTER — Ambulatory Visit
Admission: RE | Admit: 2013-02-07 | Discharge: 2013-02-07 | Disposition: A | Discharge status: Home / Self Care | Payer: BC Managed Care – PPO | Source: Ambulatory Visit | Attending: Family Medicine | Admitting: Family Medicine

## 2013-02-07 DIAGNOSIS — R9389 Abnormal findings on diagnostic imaging of other specified body structures: Secondary | ICD-10-CM

## 2013-02-15 ENCOUNTER — Other Ambulatory Visit: Payer: Self-pay

## 2014-01-07 ENCOUNTER — Other Ambulatory Visit: Payer: Self-pay | Admitting: Obstetrics and Gynecology

## 2014-01-08 LAB — CYTOLOGY - PAP

## 2015-05-16 ENCOUNTER — Ambulatory Visit
Admission: RE | Admit: 2015-05-16 | Discharge: 2015-05-16 | Disposition: A | Discharge status: Home / Self Care | Payer: BLUE CROSS/BLUE SHIELD | Source: Ambulatory Visit | Attending: Family Medicine | Admitting: Family Medicine

## 2015-05-16 ENCOUNTER — Other Ambulatory Visit: Payer: Self-pay | Admitting: Family Medicine

## 2015-05-16 DIAGNOSIS — R0602 Shortness of breath: Secondary | ICD-10-CM

## 2015-05-29 ENCOUNTER — Other Ambulatory Visit: Payer: Self-pay | Admitting: Family Medicine

## 2015-05-29 DIAGNOSIS — R198 Other specified symptoms and signs involving the digestive system and abdomen: Secondary | ICD-10-CM

## 2015-05-29 DIAGNOSIS — R0989 Other specified symptoms and signs involving the circulatory and respiratory systems: Secondary | ICD-10-CM

## 2015-05-29 DIAGNOSIS — R6881 Early satiety: Secondary | ICD-10-CM

## 2015-06-02 ENCOUNTER — Ambulatory Visit
Admission: RE | Admit: 2015-06-02 | Discharge: 2015-06-02 | Disposition: A | Discharge status: Home / Self Care | Payer: BLUE CROSS/BLUE SHIELD | Source: Ambulatory Visit | Attending: Family Medicine | Admitting: Family Medicine

## 2015-06-02 DIAGNOSIS — R198 Other specified symptoms and signs involving the digestive system and abdomen: Secondary | ICD-10-CM

## 2015-06-02 DIAGNOSIS — R0989 Other specified symptoms and signs involving the circulatory and respiratory systems: Secondary | ICD-10-CM

## 2015-06-02 DIAGNOSIS — R6881 Early satiety: Secondary | ICD-10-CM

## 2015-06-13 ENCOUNTER — Other Ambulatory Visit: Payer: Self-pay | Admitting: Gastroenterology

## 2015-08-25 ENCOUNTER — Other Ambulatory Visit (HOSPITAL_COMMUNITY): Payer: Self-pay | Admitting: Gastroenterology

## 2015-08-25 ENCOUNTER — Other Ambulatory Visit: Payer: Self-pay | Admitting: Gastroenterology

## 2015-08-25 DIAGNOSIS — R1010 Upper abdominal pain, unspecified: Secondary | ICD-10-CM

## 2015-08-28 ENCOUNTER — Ambulatory Visit (HOSPITAL_COMMUNITY)
Admission: RE | Admit: 2015-08-28 | Discharge: 2015-08-28 | Disposition: A | Discharge status: Home / Self Care | Payer: BLUE CROSS/BLUE SHIELD | Source: Ambulatory Visit | Attending: Gastroenterology | Admitting: Gastroenterology

## 2015-08-28 DIAGNOSIS — R1010 Upper abdominal pain, unspecified: Secondary | ICD-10-CM | POA: Insufficient documentation

## 2015-08-28 MED ORDER — TECHNETIUM TC 99M SULFUR COLLOID
2.0000 | Freq: Once | INTRAVENOUS | Status: AC | PRN
  Administered 2015-08-28: 2 via ORAL

## 2015-09-03 ENCOUNTER — Ambulatory Visit
Admission: RE | Admit: 2015-09-03 | Discharge: 2015-09-03 | Disposition: A | Discharge status: Home / Self Care | Payer: BLUE CROSS/BLUE SHIELD | Source: Ambulatory Visit | Attending: Gastroenterology | Admitting: Gastroenterology

## 2015-09-03 DIAGNOSIS — R1010 Upper abdominal pain, unspecified: Secondary | ICD-10-CM

## 2015-09-10 ENCOUNTER — Other Ambulatory Visit (HOSPITAL_COMMUNITY): Payer: Self-pay | Admitting: Gastroenterology

## 2015-09-10 DIAGNOSIS — R1010 Upper abdominal pain, unspecified: Secondary | ICD-10-CM

## 2015-09-17 ENCOUNTER — Encounter (HOSPITAL_COMMUNITY)
Admission: RE | Admit: 2015-09-17 | Discharge: 2015-09-17 | Disposition: A | Discharge status: Home / Self Care | Payer: BLUE CROSS/BLUE SHIELD | Source: Ambulatory Visit | Attending: Gastroenterology | Admitting: Gastroenterology

## 2015-09-17 DIAGNOSIS — R1010 Upper abdominal pain, unspecified: Secondary | ICD-10-CM

## 2015-09-17 MED ORDER — TECHNETIUM TC 99M MEBROFENIN IV KIT
5.2000 | PACK | Freq: Once | INTRAVENOUS | Status: AC | PRN
  Administered 2015-09-17: 5 via INTRAVENOUS

## 2016-11-01 IMAGING — RF DG ESOPHAGUS
19 of 24 series · 19 of 24 positions shown · non-contrast
Comparison: None.

CLINICAL DATA: 39-year-old female with globus syndrome. Initial
encounter.

EXAM:
ESOPHOGRAM / BARIUM SWALLOW / BARIUM TABLET STUDY
TECHNIQUE: Combined double contrast and single contrast examination performed
using effervescent crystals, thick barium liquid, and thin barium
liquid. The patient was observed with fluoroscopy swallowing a 13 mm
barium sulphate tablet.
FLUOROSCOPY TIME:  Radiation Exposure Index (as provided by the
fluoroscopic device): 40 d Gray/cm2
Fluoroscopy Time:  3 minutes and 42 seconds.

[Series 1: run · 1 of 2 slices shown (1 of 19)]
[im 1/2]
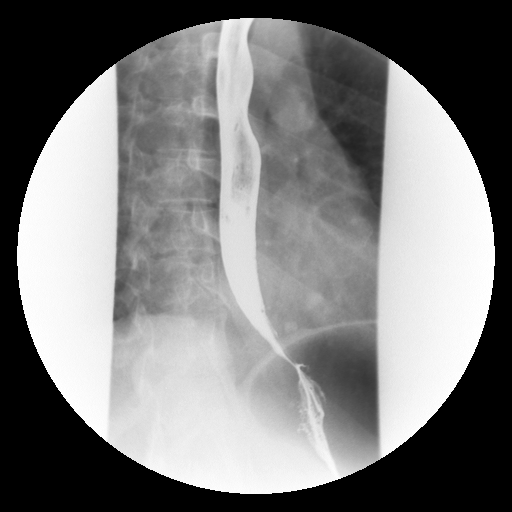

[Series 2: run · 1 of 2 slices shown (2 of 19)]
[im 1/2]
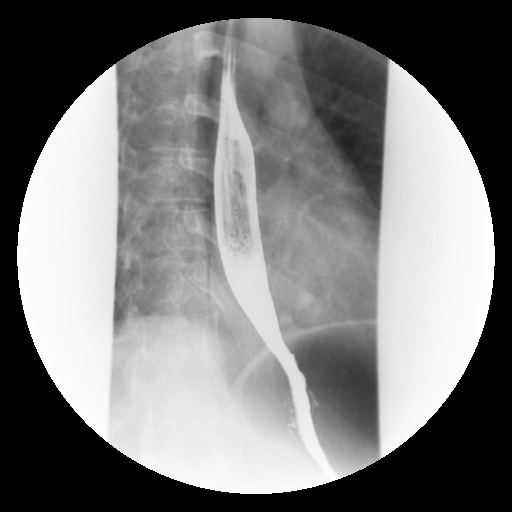

[Series 4: run · 1 of 2 slices shown (3 of 19)]
[im 1/2]
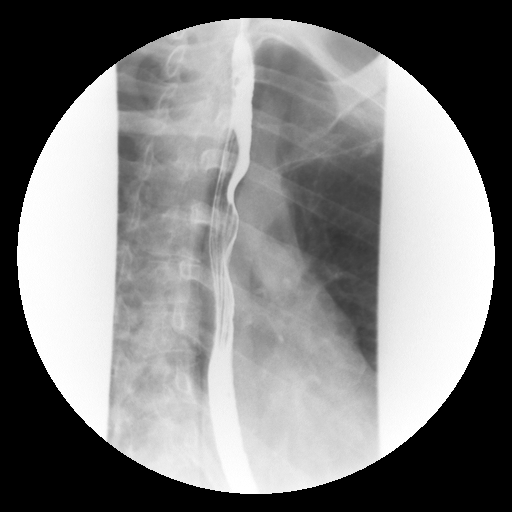

[Series 5: run · 1 of 2 slices shown (4 of 19)]
[im 1/2]
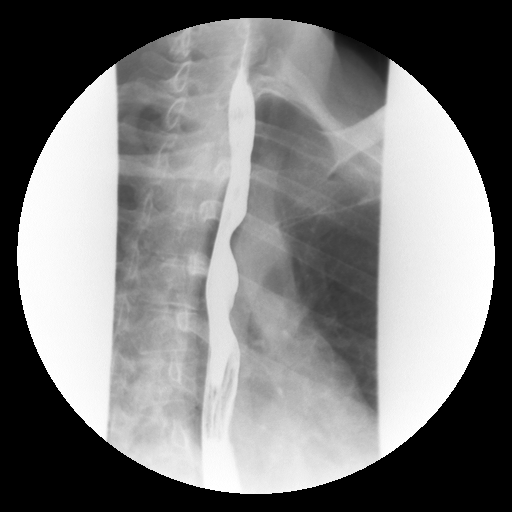

[Series 6: run · 1 of 2 slices shown (5 of 19)]
[im 1/2]
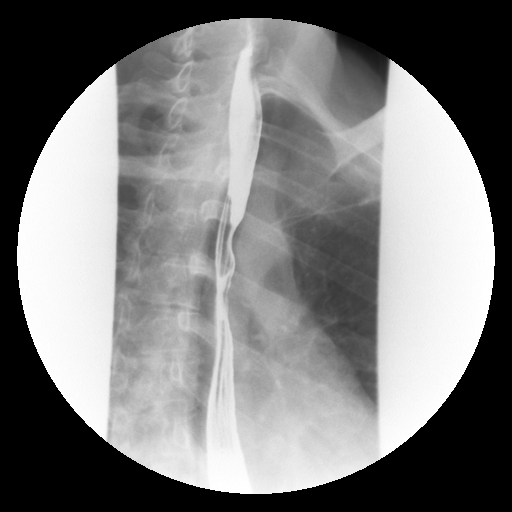

[Series 7: run · 1 of 2 slices shown (6 of 19)]
[im 1/2]
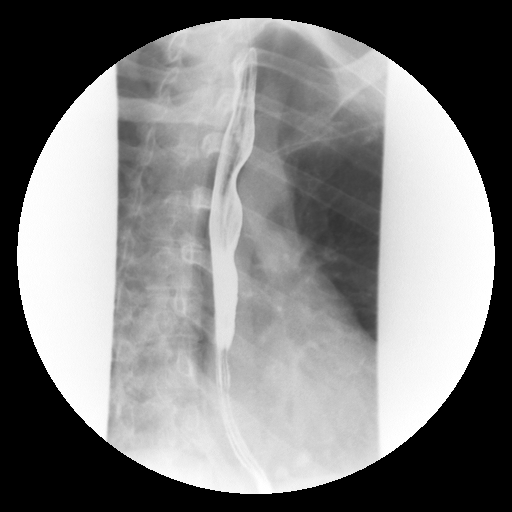

[Series 9: run · 1 of 2 slices shown (7 of 19)]
[im 1/2]
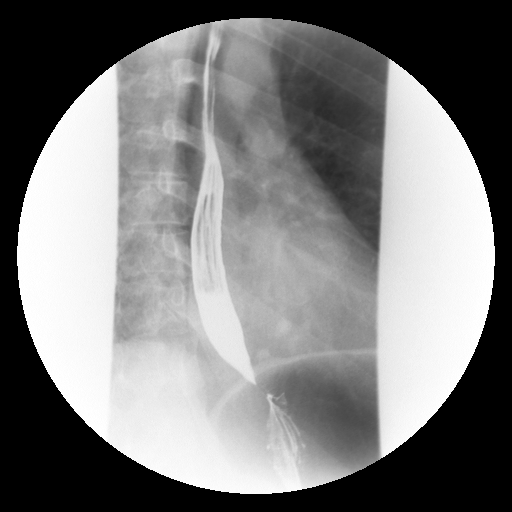

[Series 10: run · 1 of 2 slices shown (8 of 19)]
[im 1/2]
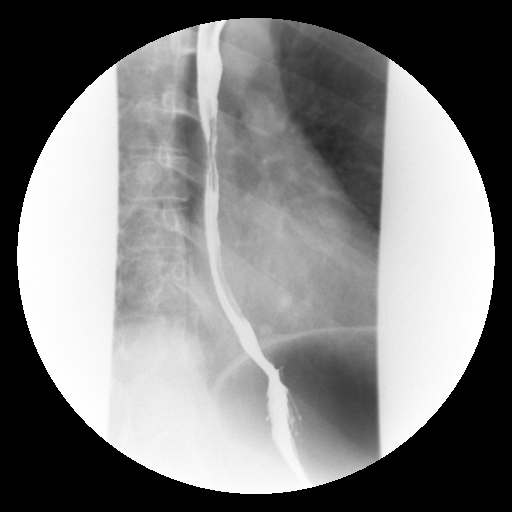

[Series 11: run · 1 of 2 slices shown (9 of 19)]
[im 1/2]
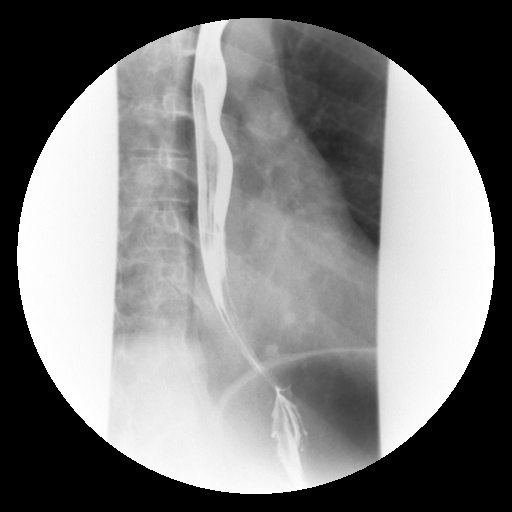

[Series 13: run · 1 of 2 slices shown (10 of 19)]
[im 1/2]
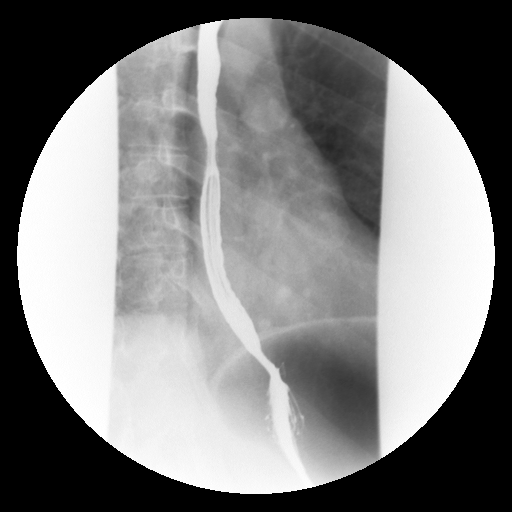

[Series 14: run · 1 of 2 slices shown (11 of 19)]
[im 1/2]
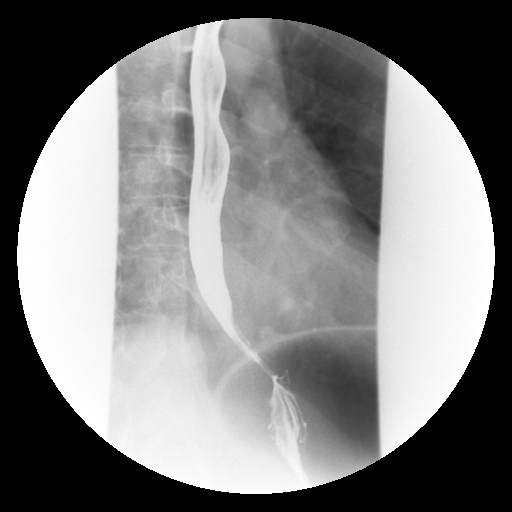

[Series 15: run · 1 of 2 slices shown (12 of 19)]
[im 1/2]
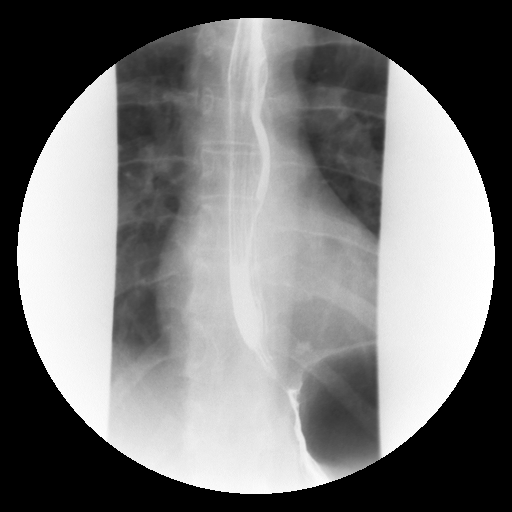

[Series 16: run · 1 of 2 slices shown (13 of 19)]
[im 1/2]
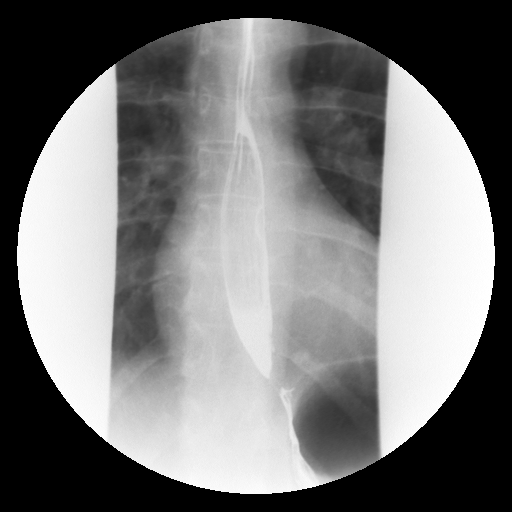

[Series 18: run · 1 of 2 slices shown (14 of 19)]
[im 1/2]
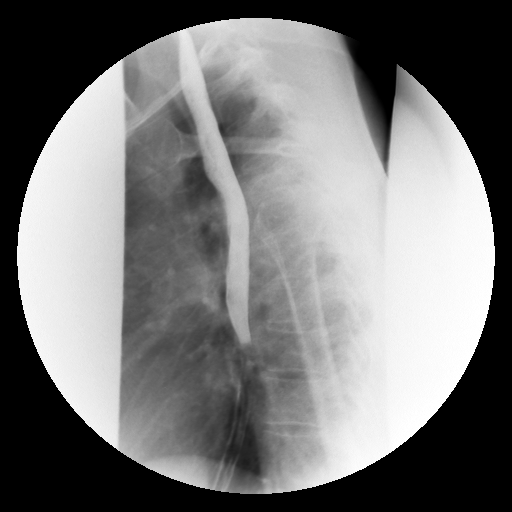

[Series 19: run · 1 of 2 slices shown (15 of 19)]
[im 1/2]
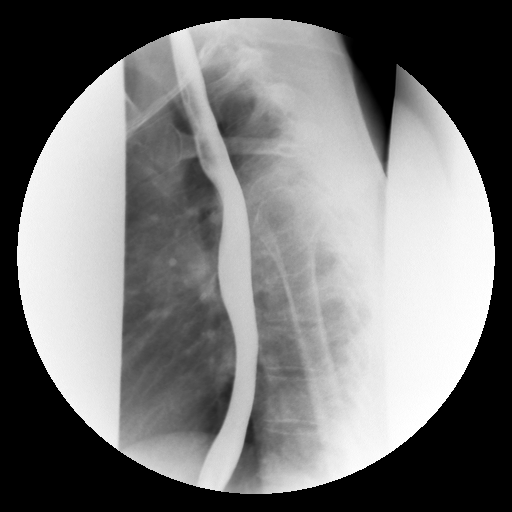

[Series 20: run · 1 of 2 slices shown (16 of 19)]
[im 1/2]
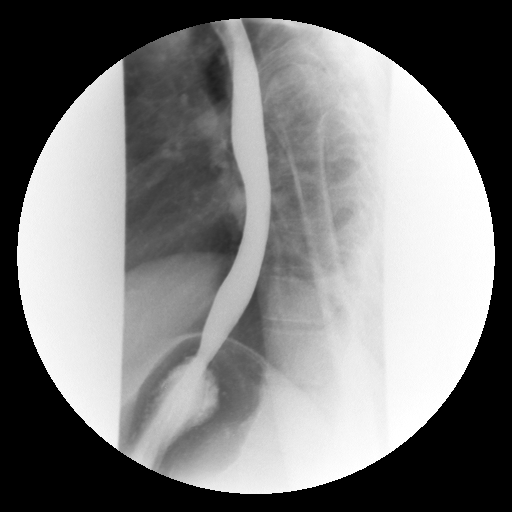

[Series 21: run · 1 of 2 slices shown (17 of 19)]
[im 1/2]
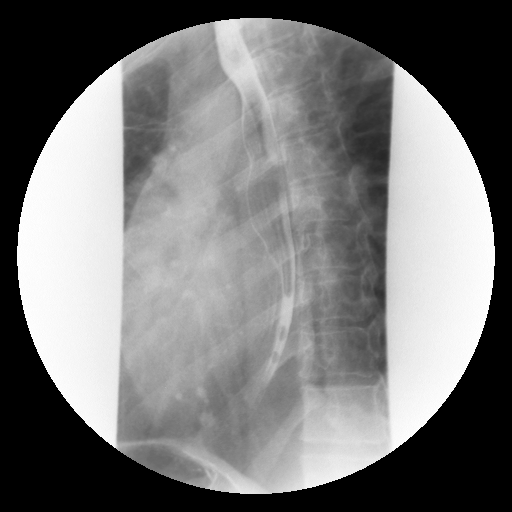

[Series 23: run · 1 of 2 slices shown (18 of 19)]
[im 1/2]
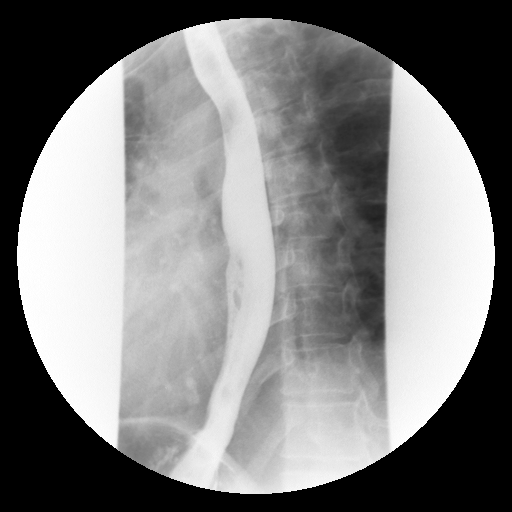

[Series 24: run · 1 of 2 slices shown (19 of 19)]
[im 1/2]
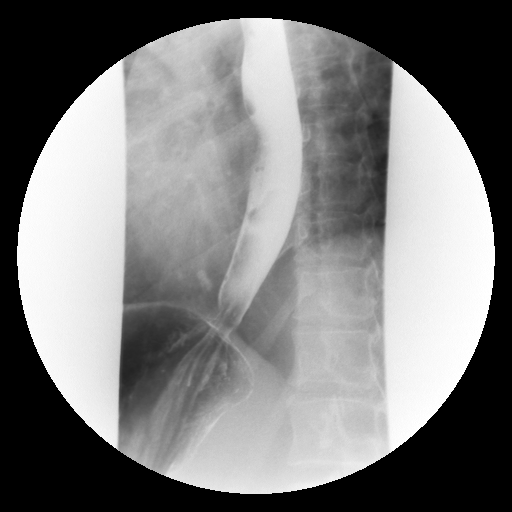

[19 of 24 positions shown; findings below may reference images not displayed]

FINDINGS: No laryngeal penetration or aspiration.

Minimal impression upon the posterior cervical esophagus at the C5-6
and C6-7 level by minimal anterior osteophyte.

Minimal asymmetry of the piriform sinus with slight underfilling on
the left versus the right of questionable significance.

Normal primary esophageal stripping where without esophageal
obstructing lesion.

Patient ingested a 13 mm barium tablet which became temporarily
lodged within the distal esophagus but cleared immediately with
subsequent swallowing. There is mild smooth narrowing at this level.

Gastroesophageal reflux to the level of the mid thoracic esophagus
without esophageal mucosa abnormality noted.
IMPRESSION: With change of patient position, gastroesophageal reflux noted to
the level of the mid thoracic esophagus. No esophageal mucosa
abnormality noted.

Patient ingested a 13 mm barium tablet which became temporarily
lodged within the distal esophagus but cleared immediately with
subsequent swallowing. There is mild smooth narrowing at this level.

Minimal impression upon the posterior cervical esophagus at the C5-6
and C6-7 level by minimal anterior osteophyte.

Minimal asymmetry of the piriform sinus with slight underfilling on
the left versus the right of questionable significance.

## 2017-02-16 IMAGING — NM NM HEPATO W/GB/PHARM/[PERSON_NAME]
2 series · 12 of 12 positions shown · non-contrast
Comparison: Abdominal ultrasound September 03, 2015

CLINICAL DATA: Abdominal pain and nausea for the past 6 months

EXAM:
NUCLEAR MEDICINE HEPATOBILIARY IMAGING WITH GALLBLADDER EF
TECHNIQUE: Sequential images of the abdomen were obtained [DATE] minutes
following intravenous administration of radiopharmaceutical. After
oral ingestion of Ensure, gallbladder ejection fraction was
determined. At 60 min, normal ejection fraction is greater than 33%.
RADIOPHARMACEUTICALS:  5.2 mCi 4c-DDm  Choletec IV

[Series 1: biliary · 3.25mm/px · 6 of 60 frames shown]
[frame 6/60]
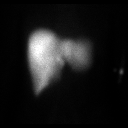
[frame 16/60]
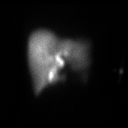
[frame 26/60]
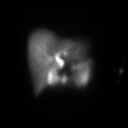
[frame 36/60]
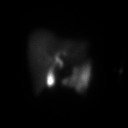
[frame 46/60]
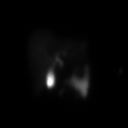
[frame 56/60]
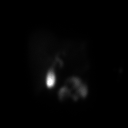

[Series 2: gbef · 3.25mm/px · 6 of 60 frames shown]
[frame 6/60]
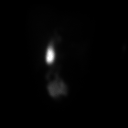
[frame 16/60]
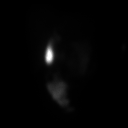
[frame 26/60]
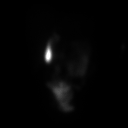
[frame 36/60]
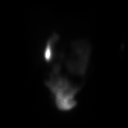
[frame 46/60]
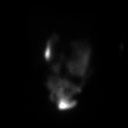
[frame 56/60]
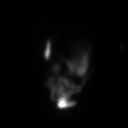

[12 of 12 positions shown; findings below may reference images not displayed]

FINDINGS: Prompt uptake and biliary excretion of activity by the liver is
seen. Gallbladder activity is visualized, consistent with patency of
cystic duct. Biliary activity passes into small bowel, consistent
with patent common bile duct.

Calculated gallbladder ejection fraction is 80%. (Normal gallbladder
ejection fraction with Ensure is greater than 33%.)
IMPRESSION: Normal hepatobiliary scan with normal gallbladder ejection fraction.

## 2018-02-15 ENCOUNTER — Other Ambulatory Visit: Payer: Self-pay | Admitting: Otolaryngology

## 2018-02-15 DIAGNOSIS — H93A2 Pulsatile tinnitus, left ear: Secondary | ICD-10-CM

## 2018-02-23 ENCOUNTER — Ambulatory Visit
Admission: RE | Admit: 2018-02-23 | Discharge: 2018-02-23 | Disposition: A | Discharge status: Home / Self Care | Payer: BLUE CROSS/BLUE SHIELD | Source: Ambulatory Visit | Attending: Otolaryngology | Admitting: Otolaryngology

## 2018-02-23 DIAGNOSIS — H93A2 Pulsatile tinnitus, left ear: Secondary | ICD-10-CM

## 2018-02-23 MED ORDER — GADOBENATE DIMEGLUMINE 529 MG/ML IV SOLN
10.0000 mL | Freq: Once | INTRAVENOUS | Status: AC | PRN
  Administered 2018-02-23: 10 mL via INTRAVENOUS

## 2019-03-14 ENCOUNTER — Other Ambulatory Visit: Payer: Self-pay

## 2019-03-14 DIAGNOSIS — Z20822 Contact with and (suspected) exposure to covid-19: Secondary | ICD-10-CM

## 2019-03-17 LAB — NOVEL CORONAVIRUS, NAA: SARS-CoV-2, NAA: DETECTED — AB

## 2019-07-09 ENCOUNTER — Ambulatory Visit: Payer: BLUE CROSS/BLUE SHIELD | Admitting: Cardiology

## 2019-07-19 ENCOUNTER — Ambulatory Visit: Payer: Self-pay

## 2019-07-26 ENCOUNTER — Ambulatory Visit: Payer: BC Managed Care – PPO | Attending: Internal Medicine

## 2019-07-26 ENCOUNTER — Ambulatory Visit: Payer: BC Managed Care – PPO | Admitting: Cardiology

## 2019-07-26 ENCOUNTER — Encounter: Payer: Self-pay | Admitting: Cardiology

## 2019-07-26 ENCOUNTER — Telehealth: Payer: Self-pay | Admitting: Radiology

## 2019-07-26 ENCOUNTER — Other Ambulatory Visit: Payer: Self-pay

## 2019-07-26 VITALS — BP 108/68 | HR 67 | Ht 62.0 in | Wt 132.0 lb

## 2019-07-26 DIAGNOSIS — Z23 Encounter for immunization: Secondary | ICD-10-CM

## 2019-07-26 DIAGNOSIS — R0789 Other chest pain: Secondary | ICD-10-CM | POA: Diagnosis not present

## 2019-07-26 DIAGNOSIS — R002 Palpitations: Secondary | ICD-10-CM | POA: Diagnosis not present

## 2019-07-26 DIAGNOSIS — U071 COVID-19: Secondary | ICD-10-CM

## 2019-07-26 NOTE — Telephone Encounter (Signed)
Enrolled patient for a 14 day Zio monitor to be mailed to patients home.  

## 2019-07-26 NOTE — Progress Notes (Signed)
   Covid-19 Vaccination Clinic  Name:  Tracey Peterson    MRN: [id number] DOB: 10/12/1975  07/26/2019  Tracey Peterson was observed post Covid-19 immunization for 15 minutes without incident. She was provided with Vaccine Information Sheet and instruction to access the V-Safe system.   Tracey Peterson was instructed to call 911 with any severe reactions post vaccine: Marland Kitchen Difficulty breathing  . Swelling of face and throat  . A fast heartbeat  . A bad rash all over body  . Dizziness and weakness   Immunizations Administered    Name Date Dose VIS Date Route   Pfizer COVID-19 Vaccine 07/26/2019 10:26 AM 0.3 mL 03/23/2019 Intramuscular   Manufacturer: [employer], [employer]   Lot: [id number]   NDC: [address]-9

## 2019-07-26 NOTE — Patient Instructions (Addendum)
Medication Instructions:  Your physician recommends that you continue on your current medications as directed. Please refer to the Current Medication list given to you today.  *If you need a refill on your cardiac medications before your next appointment, please call your pharmacy*   Testing/Procedures: Your physician has recommended that you wear an event monitor. Event monitors are medical devices that record the heart's electrical activity. Doctors most often [place] these monitors to diagnose arrhythmias. Arrhythmias are problems with the speed or rhythm of the heartbeat. The monitor is a small, portable device. You can wear one while you do your normal daily activities. This is usually used to diagnose what is causing palpitations/syncope (passing out).  Your physician has requested that you have an echocardiogram. Echocardiography is a painless test that uses sound waves to create images of your heart. It provides your doctor with information about the size and shape of your heart and how well your heart's chambers and valves are working. This procedure takes approximately one hour. There are no restrictions for this procedure.   Follow-Up: At [facility], you and your health needs are our priority.  As part of our continuing mission to provide you with exceptional heart care, we have created designated Provider Care Teams.  These Care Teams include your primary Cardiologist (physician) and Advanced Practice Providers (APPs -  Physician Assistants and Nurse Practitioners) who all work together to provide you with the care you need, when you need it.  We recommend signing up for the patient portal called "MyChart".  Sign up information is provided on this After Visit Summary.  MyChart is used to connect with patients for Virtual Visits (Telemedicine).  Patients are able to view lab/test results, encounter notes, upcoming appointments, etc.  Non-urgent messages can be sent to your provider as well.    To learn more about what you can do with MyChart, go to [email or web address].    Follow up with Dr. Radford Pax as needed based on results of testing.   Other Instructions ZIO XT- Long Term Monitor Instructions   Your physician has requested you wear your ZIO patch monitor_______days.   This is a single patch monitor.  Ir[employer] supplies one patch monitor per enrollment.  Additional stickers are not available.   Please do not apply patch if you will be having a Nuclear Stress Test, Echocardiogram, Cardiac CT, MRI, or Chest Xray during the time frame you would be wearing the monitor. The patch cannot be worn during these tests.  You cannot remove and re-apply the ZIO XT patch monitor.   Your ZIO patch monitor will be sent USPS Priority mail from [facility] directly to your home address. The monitor may also be mailed to a PO BOX if home delivery is not available.   It may take 3-5 days to receive your monitor after you have been enrolled.   Once you have received you monitor, please review enclosed instructions.  Your monitor has already been registered assigning a specific monitor serial # to you.   Applying the monitor   Shave hair from upper left chest.   Hold abrader disc by orange tab.  Rub abrader in 40 strokes over left upper chest as indicated in your monitor instructions.   Clean area with 4 enclosed alcohol pads .  Use all pads to assure are is cleaned thoroughly.  Let dry.   Apply patch as indicated in monitor instructions.  Patch will be place under collarbone on left side of chest with arrow pointing  upward.   Rub patch adhesive wings for 2 minutes.Remove white label marked "1".  Remove white label marked "2".  Rub patch adhesive wings for 2 additional minutes.   While looking in a mirror, press and release button in center of patch.  A small green light will flash 3-4 times .  This will be your only indicator the monitor has been turned on.     Do not shower for  the first 24 hours.  You may shower after the first 24 hours.   Press button if you feel a symptom. You will hear a small click.  Record Date, Time and Symptom in the Patient Log Book.   When you are ready to remove patch, follow instructions on last 2 pages of Patient Log Book.  Stick patch monitor onto last page of Patient Log Book.   Place Patient Log Book in [place] box.  Use locking tab on box and tape box closed securely.  The Orange and [employer] has [employer] on it.  Please place in mailbox as soon as possible.  Your physician should have your test results approximately 7 days after the monitor has been mailed back to [facility].   Call [facility] Customer Care at [phone] if you have questions regarding your ZIO XT patch monitor.  Call them immediately if you see an orange light blinking on your monitor.   If your monitor falls off in less than 4 days contact our Monitor department at [phone].  If your monitor becomes loose or falls off after 4 days call [employer] at [phone] for suggestions on securing your monitor.

## 2019-07-26 NOTE — Progress Notes (Signed)
Cardiology Consult Note    Date:  07/26/2019   ID:  Tracey Peterson, DOB [birth year], MRN [id number]  PCP:  Carlos Levering, PA-C  Cardiologist:  Fransico Him, MD   No chief complaint on file.   History of Present Illness:  Tracey Peterson is a 44 y.o. female who is being seen today for the evaluation of palpitations at the request of Maurice Small, MD.  This is a 44yo female with a hx of anxiety, asthma, depression and COVID 19 infection last December who is here today for evaluation of palpitations.  She tells me that she has been having palpitations for over a year and used to only occur once weekly but recently are now occurring twice daily.  The only last 1-2 minutes at a time.  She drinks caffeine in the form of tea 2 cups daily.  There is no family hx of CAD.  She denies any DOE, PND, orthopnea, LE edema, dizziness or syncope.  She has noticed that occasionally she will notice very sharp chest pain when she is exercising if her HR gets above 150bpm that feels like it is behind her left breast but no associated SOB, diaphoresis or nausea.  If her HR stays below 150bpm she has no problems with exercise. She denies any exertional pressure pain.      Past Medical History:  Diagnosis Date  . Allergic rhinitis due to pollen   . Anxiety   . Asthma   . Depression   . Dermatitis   . Eczema   . History of COVID-19   . Lung nodule   . Thrombocytopenia ([place])     Past Surgical History:  Procedure Laterality Date  . LAPAROSCOPIC APPENDECTOMY N/A 10/03/2012   Procedure: APPENDECTOMY LAPAROSCOPIC;  Surgeon: Pedro Earls, MD;  Location: [facility] ORS;  Service: General;  Laterality: N/A;  . NO PAST SURGERIES      Current Medications: Current Meds  Medication Sig  . acetaminophen (TYLENOL) 500 MG tablet Take 1 tablet (500 mg total) by mouth every 6 (six) hours as needed for pain.  Marland Kitchen ALPRAZolam (XANAX) 0.5 MG tablet Take 0.5 mg by mouth 2 (two) times daily as needed for anxiety.  Marland Kitchen  desogestrel-ethinyl estradiol (KARIVA,AZURETTE,MIRCETTE) 0.15-0.02/0.01 MG (21/5) tablet Take 1 tablet by mouth daily.  Marland Kitchen escitalopram (LEXAPRO) 10 MG tablet Take 10 mg by mouth daily.  Marland Kitchen ibuprofen (ADVIL,MOTRIN) 200 MG tablet Take 600 mg by mouth every 8 (eight) hours as needed for pain.  Marland Kitchen levocetirizine (XYZAL) 5 MG tablet Take 5 mg by mouth every evening.  . montelukast (SINGULAIR) 10 MG tablet Take 10 mg by mouth at bedtime.  . Multiple Vitamin (MULTIVITAMIN WITH MINERALS) TABS Take 1 tablet by mouth daily.  . Polyethylene Glycol 3350 (MIRALAX PO) Take by mouth.    Allergies:   Penicillins   Social History   Socioeconomic History  . Marital status: Married    Spouse name: Not on file  . Number of children: Not on file  . Years of education: Not on file  . Highest education level: Not on file  Occupational History  . Not on file  Tobacco Use  . Smoking status: Never Smoker  . Smokeless tobacco: Never Used  Substance and Sexual Activity  . Alcohol use: Yes    Comment: 3 glasses of wine per week  . Drug use: No  . Sexual activity: Not on file  Other Topics Concern  . Not on file  Social History Narrative  .  Not on file   Social Determinants of Health   Financial Resource Strain:   . Difficulty of Paying Living Expenses:   Food Insecurity:   . Worried About Programme researcher, broadcasting/film/video in the Last Year:   . Barista in the Last Year:   Transportation Needs:   . Freight forwarder (Medical):   Marland Kitchen Lack of Transportation (Non-Medical):   Physical Activity:   . Days of Exercise per Week:   . Minutes of Exercise per Session:   Stress:   . Feeling of Stress :   Social Connections:   . Frequency of Communication with Friends and Family:   . Frequency of Social Gatherings with Friends and Family:   . Attends Religious Services:   . Active Member of Clubs or Organizations:   . Attends Banker Meetings:   Marland Kitchen Marital Status:      Family History:  The  patient's family history includes Cancer in her mother; Diabetes in an other family member.   ROS:   Please see the history of present illness.    ROS All other systems reviewed and are negative.  No flowsheet data found.     PHYSICAL EXAM:   VS:  BP 108/68   Pulse 67   Ht 5\' 2"  (1.575 m)   Wt 132 lb (59.9 kg)   BMI 24.14 kg/m    GEN: Well nourished, well developed, in no acute distress  HEENT: normal  Neck: no JVD, carotid bruits, or masses Cardiac: RRR; no murmurs, rubs, or gallops,no edema.  Intact distal pulses bilaterally.  Respiratory:  clear to auscultation bilaterally, normal work of breathing GI: soft, nontender, nondistended, + BS MS: no deformity or atrophy  Skin: warm and dry, no rash Neuro:  Alert and Oriented x 3, Strength and sensation are intact Psych: euthymic mood, full affect  Wt Readings from Last 3 Encounters:  07/26/19 132 lb (59.9 kg)  10/24/12 122 lb 6.4 oz (55.5 kg)  10/03/12 125 lb (56.7 kg)      Studies/Labs Reviewed:   EKG:  EKG is ordered today.  The ekg ordered today demonstrates NSR with no ST changes  Recent Labs: No results found for requested labs within last 8760 hours.   Lipid Panel No results found for: CHOL, TRIG, HDL, CHOLHDL, VLDL, LDLCALC, LDLDIRECT  Additional studies/ records that were reviewed today include:  OV notes from PCP    ASSESSMENT:    1. Palpitations   2. Atypical chest pain   3. COVID-19 virus infection      PLAN:  In order of problems listed above:  1. Palpitations -unclear etiology -will check a 2 week Zio patch to assess for arrhythmia -encouraged her to limit her intake of caffeine  2.  Chest pain -very atypical and pinpoint under her left breast that is very sharp and only occurs when her HR gets > 150bpm with exercise but otherwise and can exercise with no problems  3.  COVID 19 -this occurred in Dec 2020 -will check 2D echo to make sure LVF is normal    Medication Adjustments/Labs  and Tests Ordered: Current medicines are reviewed at length with the patient today.  Concerns regarding medicines are outlined above.  Medication changes, Labs and Tests ordered today are listed in the Patient Instructions below.  Patient Instructions  Medication Instructions:  Your physician recommends that you continue on your current medications as directed. Please refer to the Current Medication list given to you today.  *  If you need a refill on your cardiac medications before your next appointment, please call your pharmacy*   Testing/Procedures: Your physician has recommended that you wear an event monitor. Event monitors are medical devices that record the heart's electrical activity. Doctors most often [place] these monitors to diagnose arrhythmias. Arrhythmias are problems with the speed or rhythm of the heartbeat. The monitor is a small, portable device. You can wear one while you do your normal daily activities. This is usually used to diagnose what is causing palpitations/syncope (passing out).  Your physician has requested that you have an echocardiogram. Echocardiography is a painless test that uses sound waves to create images of your heart. It provides your doctor with information about the size and shape of your heart and how well your heart's chambers and valves are working. This procedure takes approximately one hour. There are no restrictions for this procedure.   Follow-Up: At [facility], you and your health needs are our priority.  As part of our continuing mission to provide you with exceptional heart care, we have created designated Provider Care Teams.  These Care Teams include your primary Cardiologist (physician) and Advanced Practice Providers (APPs -  Physician Assistants and Nurse Practitioners) who all work together to provide you with the care you need, when you need it.  We recommend signing up for the patient portal called "MyChart".  Sign up information is  provided on this After Visit Summary.  MyChart is used to connect with patients for Virtual Visits (Telemedicine).  Patients are able to view lab/test results, encounter notes, upcoming appointments, etc.  Non-urgent messages can be sent to your provider as well.   To learn more about what you can do with MyChart, go to [email or web address].    Follow up with Dr. Mayford Knife as needed based on results of testing.   Other Instructions ZIO XT- Long Term Monitor Instructions   Your physician has requested you wear your ZIO patch monitor_______days.   This is a single patch monitor.  [employer] supplies one patch monitor per enrollment.  Additional stickers are not available.   Please do not apply patch if you will be having a Nuclear Stress Test, Echocardiogram, Cardiac CT, MRI, or Chest Xray during the time frame you would be wearing the monitor. The patch cannot be worn during these tests.  You cannot remove and re-apply the ZIO XT patch monitor.   Your ZIO patch monitor will be sent USPS Priority mail from [facility] directly to your home address. The monitor may also be mailed to a PO BOX if home delivery is not available.   It may take 3-5 days to receive your monitor after you have been enrolled.   Once you have received you monitor, please review enclosed instructions.  Your monitor has already been registered assigning a specific monitor serial # to you.   Applying the monitor   Shave hair from upper left chest.   Hold abrader disc by orange tab.  Rub abrader in 40 strokes over left upper chest as indicated in your monitor instructions.   Clean area with 4 enclosed alcohol pads .  Use all pads to assure are is cleaned thoroughly.  Let dry.   Apply patch as indicated in monitor instructions.  Patch will be place under collarbone on left side of chest with arrow pointing upward.   Rub patch adhesive wings for 2 minutes.Remove white label marked "1".  Remove white label marked "2".   Rub patch adhesive wings for  2 additional minutes.   While looking in a mirror, press and release button in center of patch.  A small green light will flash 3-4 times .  This will be your only indicator the monitor has been turned on.     Do not shower for the first 24 hours.  You may shower after the first 24 hours.   Press button if you feel a symptom. You will hear a small click.  Record Date, Time and Symptom in the Patient Log Book.   When you are ready to remove patch, follow instructions on last 2 pages of Patient Log Book.  Stick patch monitor onto last page of Patient Log Book.   Place Patient Log Book in [place] box.  Use locking tab on box and tape box closed securely.  The [employer] and [employer] has [employer] on it.  Please place in mailbox as soon as possible.  Your physician should have your test results approximately 7 days after the monitor has been mailed back to [facility].   Call [facility] Customer Care at [phone] if you have questions regarding your ZIO XT patch monitor.  Call them immediately if you see an orange light blinking on your monitor.   If your monitor falls off in less than 4 days contact our Monitor department at [phone].  If your monitor becomes loose or falls off after 4 days call [employer] at [phone] for suggestions on securing your monitor.       Signed, Armanda Magic, MD  07/26/2019 9:10 AM    [facility] [address] [place], [place], [place]  [address] Phone: [phone]; Fax: [phone]

## 2019-08-01 ENCOUNTER — Other Ambulatory Visit (INDEPENDENT_AMBULATORY_CARE_PROVIDER_SITE_OTHER): Payer: BC Managed Care – PPO

## 2019-08-01 DIAGNOSIS — R002 Palpitations: Secondary | ICD-10-CM | POA: Diagnosis not present

## 2019-08-10 ENCOUNTER — Other Ambulatory Visit (HOSPITAL_COMMUNITY): Payer: BC Managed Care – PPO

## 2019-08-21 ENCOUNTER — Ambulatory Visit: Payer: BC Managed Care – PPO | Attending: Internal Medicine

## 2019-08-21 DIAGNOSIS — Z23 Encounter for immunization: Secondary | ICD-10-CM

## 2019-08-21 NOTE — Progress Notes (Signed)
   Covid-19 Vaccination Clinic  Name:  Tracey Peterson    MRN: [id number] DOB: [birth year]  08/21/2019  [name] was observed post Covid-19 immunization for 15 minutes without incident. She was provided with Vaccine Information Sheet and instruction to access the V-Safe system.   [name] was instructed to call 911 with any severe reactions post vaccine: Marland Kitchen Difficulty breathing  . Swelling of face and throat  . A fast heartbeat  . A bad rash all over body  . Dizziness and weakness   Immunizations Administered    Name Date Dose VIS Date Route   Pfizer COVID-19 Vaccine 08/21/2019  8:56 AM 0.3 mL 06/06/2018 Intramuscular   Manufacturer: [employer], [employer]   Lot: [id number]   NDC: [address]-4

## 2019-08-24 ENCOUNTER — Ambulatory Visit (HOSPITAL_COMMUNITY): Payer: BC Managed Care – PPO | Attending: Cardiovascular Disease

## 2019-08-24 ENCOUNTER — Other Ambulatory Visit: Payer: Self-pay

## 2019-08-24 DIAGNOSIS — R002 Palpitations: Secondary | ICD-10-CM | POA: Insufficient documentation

## 2019-09-04 ENCOUNTER — Telehealth: Payer: Self-pay | Admitting: Cardiology

## 2019-09-04 ENCOUNTER — Telehealth: Payer: Self-pay

## 2019-09-04 DIAGNOSIS — R002 Palpitations: Secondary | ICD-10-CM

## 2019-09-04 MED ORDER — METOPROLOL SUCCINATE ER 25 MG PO TB24
12.5000 mg | ORAL_TABLET | Freq: Every day | ORAL | 3 refills | Status: DC
Start: 2019-09-04 — End: 2020-09-16

## 2019-09-04 NOTE — Telephone Encounter (Signed)
Patient returning Carly's call from yesterday in regards to heart monitor results.

## 2019-09-04 NOTE — Telephone Encounter (Signed)
The patient has been notified of the result and verbalized understanding.  All questions (if any) were answered. Theresia Majors, RN 09/04/2019 4:48 PM

## 2019-09-04 NOTE — Telephone Encounter (Signed)
Tracey Reichert, MD  Tracey Goodpasture, PA-C; Theresia Majors, RN  Please let patient know that heart monitor showed extra heart beats from the top of the heart which are benign. Start Toprol XL 12.5mg  daily and followup with PA in 4 weeks. Please have her come in for BMET and TSH    The patient has been notified of the result and verbalized understanding.  All questions (if any) were answered. Theresia Majors, RN 09/04/2019 4:34 PM

## 2019-09-05 ENCOUNTER — Other Ambulatory Visit: Payer: Self-pay

## 2019-09-05 ENCOUNTER — Other Ambulatory Visit: Payer: BC Managed Care – PPO | Admitting: *Deleted

## 2019-09-05 DIAGNOSIS — R002 Palpitations: Secondary | ICD-10-CM

## 2019-09-05 LAB — BASIC METABOLIC PANEL
BUN/Creatinine Ratio: 13 (ref 9–23)
BUN: 13 mg/dL (ref 6–24)
CO2: 23 mmol/L (ref 20–29)
Calcium: 9.3 mg/dL (ref 8.7–10.2)
Chloride: 105 mmol/L (ref 96–106)
Creatinine, Ser: 1 mg/dL (ref 0.57–1.00)
GFR calc Af Amer: 80 mL/min/{1.73_m2} (ref >59)
GFR calc non Af Amer: 70 mL/min/{1.73_m2} (ref >59)
Glucose: 55 mg/dL — ABNORMAL LOW (ref 65–99)
Potassium: 4.1 mmol/L (ref 3.5–5.2)
Sodium: 141 mmol/L (ref 134–144)

## 2019-09-05 LAB — TSH: TSH: 1.95 u[IU]/mL (ref 0.450–4.500)

## 2019-10-03 NOTE — Progress Notes (Signed)
Cardiology Office Note    Date:  10/09/2019   ID:  Lemmie, Steinhaus 1975/12/03, MRN [id number]  PCP:  Carilyn Goodpasture, PA-C  Cardiologist: Armanda Magic, MD EPS: None  No chief complaint on file.   History of Present Illness:  Tracey Peterson is a 44 y.o. female with history of anxiety, asthma, depression, COVID-19 infection 03/2019.  Patient saw Dr. Mayford Knife 07/26/2019 for palpitations lasting 1 to 2 minutes for the past year.  Was having pinpoint chest pain under her left breast if her heart rate got over 150 bpm.  2-week monitor showed PACs and nonsustained atrial tachycardia up to 12 beats rare PVCs.  Toprol XL 12.5 mg daily added. 2D echo normal LVEF 60 to 65% trivial MR and AI. BMET and TSH normal.  Patient comes in for f/u. Racing has settled down but has some skipping/palpitations. Anxiety has improved with Toprol.Still exercising regularly. BP low but no dizziness.  Past Medical History:  Diagnosis Date  . Allergic rhinitis due to pollen   . Anxiety   . Asthma   . Depression   . Dermatitis   . Eczema   . History of COVID-19   . Lung nodule   . Thrombocytopenia (HCC)     Past Surgical History:  Procedure Laterality Date  . LAPAROSCOPIC APPENDECTOMY N/A 10/03/2012   Procedure: APPENDECTOMY LAPAROSCOPIC;  Surgeon: Valarie Merino, MD;  Location: [facility];  Service: General;  Laterality: N/A;  . NO PAST SURGERIES      Current Medications: Current Meds  Medication Sig  . acetaminophen (TYLENOL) 500 MG tablet Take 1 tablet (500 mg total) by mouth every 6 (six) hours as needed for pain.  Marland Kitchen ALPRAZolam (XANAX) 0.5 MG tablet Take 0.5 mg by mouth 2 (two) times daily as needed for anxiety.  Marland Kitchen desogestrel-ethinyl estradiol (KARIVA,AZURETTE,MIRCETTE) 0.15-0.02/0.01 MG (21/5) tablet Take 1 tablet by mouth daily.  Marland Kitchen escitalopram (LEXAPRO) 10 MG tablet Take 10 mg by mouth daily.  Marland Kitchen ibuprofen (ADVIL,MOTRIN) 200 MG tablet Take 600 mg by mouth every 8 (eight) hours as needed for  pain.  Marland Kitchen levocetirizine (XYZAL) 5 MG tablet Take 5 mg by mouth every evening.  . metoprolol succinate (TOPROL XL) 25 MG 24 hr tablet Take 0.5 tablets (12.5 mg total) by mouth daily.  . montelukast (SINGULAIR) 10 MG tablet Take 10 mg by mouth at bedtime.  . Multiple Vitamin (MULTIVITAMIN WITH MINERALS) TABS Take 1 tablet by mouth daily.  . Polyethylene Glycol 3350 (MIRALAX PO) Take by mouth.     Allergies:   Penicillins   Social History   Socioeconomic History  . Marital status: Married    Spouse name: Not on file  . Number of children: Not on file  . Years of education: Not on file  . Highest education level: Not on file  Occupational History  . Not on file  Tobacco Use  . Smoking status: Never Smoker  . Smokeless tobacco: Never Used  Substance and Sexual Activity  . Alcohol use: Yes    Comment: 3 glasses of wine per week  . Drug use: No  . Sexual activity: Not on file  Other Topics Concern  . Not on file  Social History Narrative  . Not on file   Social Determinants of Health   Financial Resource Strain:   . Difficulty of Paying Living Expenses:   Food Insecurity:   . Worried About Programme researcher, broadcasting/film/video in the Last Year:   . [employer]  in the Last Year:   Transportation Needs:   . Film/video editor (Medical):   Marland Kitchen Lack of Transportation (Non-Medical):   Physical Activity:   . Days of Exercise per Week:   . Minutes of Exercise per Session:   Stress:   . Feeling of Stress :   Social Connections:   . Frequency of Communication with Friends and Family:   . Frequency of Social Gatherings with Friends and Family:   . Attends Religious Services:   . Active Member of Clubs or Organizations:   . Attends Archivist Meetings:   Marland Kitchen Marital Status:      Family History:  The patient's family history includes Cancer in her mother; Diabetes in an other family member.   ROS:   Please see the history of present illness.    ROS All other systems reviewed and  are negative.   PHYSICAL EXAM:   VS:  BP (!) 96/50   Pulse 70   Ht 5\' 2"  (1.575 m)   Wt 128 lb (58.1 kg)   SpO2 100%   BMI 23.41 kg/m   Physical Exam  JKD:TOIZ, in no acute distress  Neck: no JVD, carotid bruits, or masses Cardiac:RRR; no murmurs, rubs, or gallops  Respiratory:  clear to auscultation bilaterally, normal work of breathing GI: soft, nontender, nondistended, + BS Ext: without cyanosis, clubbing, or edema, Good distal pulses bilaterally Neuro:  Alert and Oriented x 3 Psych: euthymic mood, full affect  Wt Readings from Last 3 Encounters:  10/09/19 128 lb (58.1 kg)  07/26/19 132 lb (59.9 kg)  10/24/12 122 lb 6.4 oz (55.5 kg)      Studies/Labs Reviewed:   EKG:  EKG is no ordered today.  Recent Labs: 09/05/2019: BUN 13; Creatinine, Ser 1.00; Potassium 4.1; Sodium 141; TSH 1.950   Lipid Panel No results found for: CHOL, TRIG, HDL, CHOLHDL, VLDL, LDLCALC, LDLDIRECT  Additional studies/ records that were reviewed today include:   2D echo 08/24/2019  IMPRESSIONS     1. Left ventricular ejection fraction, by estimation, is 60 to 65%. The  left ventricle has normal function. The left ventricle has no regional  wall motion abnormalities. Left ventricular diastolic parameters were  normal. The average left ventricular  global longitudinal strain is -23.3 %.   2. Right ventricular systolic function is normal. The right ventricular  size is normal. Tricuspid regurgitation signal is inadequate for assessing  PA pressure.   3. The mitral valve is normal in structure. Trivial mitral valve  regurgitation. No evidence of mitral stenosis.   4. The aortic valve is normal in structure. Aortic valve regurgitation is  trivial. No aortic stenosis is present.   5. The inferior vena cava is normal in size with greater than 50%  respiratory variability, suggesting right atrial pressure of 3 mmHg.   FINDINGS   Left Ventricle: Left ventricular ejection fraction, by  estimation, is 60  to 65%. The left ventricle has normal function. The left ventricle has no  regional wall motion abnormalities. The average left ventricular global  longitudinal strain is -23.3 %.  The left ventricular internal cavity size was normal in size. There is no  left ventricular hypertrophy. Left ventricular diastolic parameters were  normal. Normal left ventricular filling pressure.   Right Ventricle: The right ventricular size is normal. No increase in  right ventricular wall thickness. Right ventricular systolic function is  normal. Tricuspid regurgitation signal is inadequate for assessing PA  pressure.   Left Atrium: Left  atrial size was normal in size.   Right Atrium: Right atrial size was normal in size.   Pericardium: There is no evidence of pericardial effusion.   Mitral Valve: The mitral valve is normal in structure. Normal mobility of  the mitral valve leaflets. Trivial mitral valve regurgitation. No evidence  of mitral valve stenosis.   Tricuspid Valve: The tricuspid valve is normal in structure. Tricuspid  valve regurgitation is not demonstrated. No evidence of tricuspid  stenosis.   Aortic Valve: The aortic valve is normal in structure. Aortic valve  regurgitation is trivial. No aortic stenosis is present.   Pulmonic Valve: The pulmonic valve was normal in structure. Pulmonic valve  regurgitation is not visualized. No evidence of pulmonic stenosis.   Aorta: The aortic root is normal in size and structure.   Venous: The inferior vena cava is normal in size with greater than 50%  respiratory variability, suggesting right atrial pressure of 3 mmHg.   IAS/Shunts: No atrial level shunt detected by color flow Doppler.      Holter monitor 5/20/2021Sinus bradycardia, normal sinus rhythm, sinus tachycardia. The average heart rate was 71bpm and ranged from 46 to 145bpm.  PACs and nonsustained atrial tachycardia up to 12 beats.  Rare PVC.         ASSESSMENT:    1. Palpitations   2. Atrial tachycardia (HCC)   3. PAC (premature atrial contraction)   4. COVID-19 virus infection      PLAN:  In order of problems listed above:  Palpitations with PACs and nonsustained atrial tachycardia up to 12 beats on Holter monitor 08/30/2019 started on Toprol-XL 12.5 mg daily.  Potassium TSH normal. Palpitations and anxiety improved. Continue toprol. F/u in 6 months.  COVID-19 infection 03/2019 2D echo with normal LV function trivial MR and AI.  Medication Adjustments/Labs and Tests Ordered: Current medicines are reviewed at length with the patient today.  Concerns regarding medicines are outlined above.  Medication changes, Labs and Tests ordered today are listed in the Patient Instructions below. Patient Instructions  Medication Instructions:  Your physician recommends that you continue on your current medications as directed. Please refer to the Current Medication list given to you today.  *If you need a refill on your cardiac medications before your next appointment, please call your pharmacy*   Lab Work: None  If you have labs (blood work) drawn today and your tests are completely normal, you will receive your results only by: Marland Kitchen MyChart Message (if you have MyChart) OR . A paper copy in the mail If you have any lab test that is abnormal or we need to change your treatment, we will call you to review the results.   Testing/Procedures: None   Follow-Up: At [facility], you and your health needs are our priority.  As part of our continuing mission to provide you with exceptional heart care, we have created designated Provider Care Teams.  These Care Teams include your primary Cardiologist (physician) and Advanced Practice Providers (APPs -  Physician Assistants and Nurse Practitioners) who all work together to provide you with the care you need, when you need it.  We recommend signing up for the patient portal called  "MyChart".  Sign up information is provided on this After Visit Summary.  MyChart is used to connect with patients for Virtual Visits (Telemedicine).  Patients are able to view lab/test results, encounter notes, upcoming appointments, etc.  Non-urgent messages can be sent to your provider as well.   To learn  more about what you can do with MyChart, go to [email or web address].    Your next appointment:   6 month(s)  The format for your next appointment:   In Person  Provider:   You may see Armanda Magic, MD or one of the following Advanced Practice Providers on your designated Care Team:    Ronie Spies, PA-C  Jacolyn Reedy, PA-C    Other Instructions None     Signed, Jacolyn Reedy, PA-C  10/09/2019 8:08 AM    [facility] [address] [place], [place], [place]  [address] Phone: [phone]; Fax: [phone]

## 2019-10-09 ENCOUNTER — Encounter: Payer: Self-pay | Admitting: Physician Assistant

## 2019-10-09 ENCOUNTER — Ambulatory Visit: Payer: BC Managed Care – PPO | Admitting: Physician Assistant

## 2019-10-09 ENCOUNTER — Other Ambulatory Visit: Payer: Self-pay

## 2019-10-09 VITALS — BP 96/50 | HR 70 | Ht 62.0 in | Wt 128.0 lb

## 2019-10-09 DIAGNOSIS — I491 Atrial premature depolarization: Secondary | ICD-10-CM

## 2019-10-09 DIAGNOSIS — R002 Palpitations: Secondary | ICD-10-CM | POA: Diagnosis not present

## 2019-10-09 DIAGNOSIS — I4719 Other supraventricular tachycardia: Secondary | ICD-10-CM | POA: Insufficient documentation

## 2019-10-09 DIAGNOSIS — I471 Supraventricular tachycardia: Secondary | ICD-10-CM | POA: Diagnosis not present

## 2019-10-09 DIAGNOSIS — U071 COVID-19: Secondary | ICD-10-CM

## 2019-10-09 NOTE — Patient Instructions (Signed)
Medication Instructions:  Your physician recommends that you continue on your current medications as directed. Please refer to the Current Medication list given to you today.  *If you need a refill on your cardiac medications before your next appointment, please call your pharmacy*   Lab Work: None  If you have labs (blood work) drawn today and your tests are completely normal, you will receive your results only by: . MyChart Message (if you have MyChart) OR . A paper copy in the mail If you have any lab test that is abnormal or we need to change your treatment, we will call you to review the results.   Testing/Procedures: None   Follow-Up: At [facility], you and your health needs are our priority.  As part of our continuing mission to provide you with exceptional heart care, we have created designated Provider Care Teams.  These Care Teams include your primary Cardiologist (physician) and Advanced Practice Providers (APPs -  Physician Assistants and Nurse Practitioners) who all work together to provide you with the care you need, when you need it.  We recommend signing up for the patient portal called "MyChart".  Sign up information is provided on this After Visit Summary.  MyChart is used to connect with patients for Virtual Visits (Telemedicine).  Patients are able to view lab/test results, encounter notes, upcoming appointments, etc.  Non-urgent messages can be sent to your provider as well.   To learn more about what you can do with MyChart, go to [email or web address].    Your next appointment:   6 month(s)  The format for your next appointment:   In Person  Provider:   You may see Traci Turner, MD or one of the following Advanced Practice Providers on your designated Care Team:    Dayna Dunn, PA-C  Michele Lenze, PA-C    Other Instructions None  

## 2020-04-07 ENCOUNTER — Other Ambulatory Visit: Payer: Self-pay

## 2020-04-07 ENCOUNTER — Encounter: Payer: Self-pay | Admitting: Cardiology

## 2020-04-07 ENCOUNTER — Ambulatory Visit: Payer: BC Managed Care – PPO | Admitting: Cardiology

## 2020-04-07 VITALS — BP 106/74 | HR 74 | Ht 62.0 in | Wt 133.8 lb

## 2020-04-07 DIAGNOSIS — I471 Supraventricular tachycardia: Secondary | ICD-10-CM | POA: Diagnosis not present

## 2020-04-07 DIAGNOSIS — R0789 Other chest pain: Secondary | ICD-10-CM

## 2020-04-07 DIAGNOSIS — U071 COVID-19: Secondary | ICD-10-CM | POA: Diagnosis not present

## 2020-04-07 DIAGNOSIS — I4719 Other supraventricular tachycardia: Secondary | ICD-10-CM | POA: Insufficient documentation

## 2020-04-07 NOTE — Patient Instructions (Signed)

## 2020-04-07 NOTE — Progress Notes (Signed)
Office Note   Date:  04/07/2020   ID:  Josslyn, Ciolek 10-14-1975, MRN [id number]  PCP:  Carilyn Goodpasture, PA-C  Cardiologist:  Armanda Magic, MD   Chief Complaint  Patient presents with  . Follow-up    PAT, atypical CP    History of Present Illness:  Tracey Peterson is a 44 y.o. female with a hx of anxiety, asthma, depression and COVID 19 infection last December who is here today followup of palpitations.  When I saw her last she had been having palpitations for over a year and used to only occur once weekly but then increased and was referred for evaluation.   She was drinking caffeine in the form of tea 2 cups daily.  There is no family hx of CAD.  Event monitor showed nonsustained atrial tachycardia up to 10 beats at a time.  BMET and TSH were normal.  2D echo showed normal LVF and trivial MR and AR.  She was started on Toprol XL 12.5mg  daily and was seen back by Herma Carson, PA and was doing better as far as heart racing was concerned but still noticing some skipping and her anxiety also improved on Toprol.  No changes were made to her meds at that time.   She is here today for followup and is doing well.  She denies any chest pain or pressure, SOB, DOE, PND, orthopnea, LE edema, dizziness or syncope. She still has occasional palpitations but only for a couple seconds and pretty asymptomatic with it.  She coughs and it resolves. She has decreased the amount of caffeine she drinks.  She is compliant with her meds and is tolerating meds with no SE.     Past Medical History:  Diagnosis Date  . Allergic rhinitis due to pollen   . Anxiety   . Asthma   . Depression   . Dermatitis   . Eczema   . History of COVID-19   . Lung nodule   . PAT (paroxysmal atrial tachycardia) (HCC)   . Thrombocytopenia (HCC)     Past Surgical History:  Procedure Laterality Date  . LAPAROSCOPIC APPENDECTOMY N/A 10/03/2012   Procedure: APPENDECTOMY LAPAROSCOPIC;  Surgeon: Valarie Merino, MD;   Location: [facility];  Service: General;  Laterality: N/A;  . NO PAST SURGERIES      Current Medications: Current Meds  Medication Sig  . acetaminophen (TYLENOL) 500 MG tablet Take 1 tablet (500 mg total) by mouth every 6 (six) hours as needed for pain.  Marland Kitchen ALPRAZolam (XANAX) 0.5 MG tablet Take 0.5 mg by mouth 2 (two) times daily as needed for anxiety.  Marland Kitchen desogestrel-ethinyl estradiol (KARIVA,AZURETTE,MIRCETTE) 0.15-0.02/0.01 MG (21/5) tablet Take 1 tablet by mouth daily.  Marland Kitchen escitalopram (LEXAPRO) 10 MG tablet Take 10 mg by mouth daily.  Marland Kitchen ibuprofen (ADVIL,MOTRIN) 200 MG tablet Take 600 mg by mouth every 8 (eight) hours as needed for pain.  Marland Kitchen levocetirizine (XYZAL) 5 MG tablet Take 5 mg by mouth every evening.  . metoprolol succinate (TOPROL XL) 25 MG 24 hr tablet Take 0.5 tablets (12.5 mg total) by mouth daily.  . montelukast (SINGULAIR) 10 MG tablet Take 10 mg by mouth at bedtime.  . Multiple Vitamin (MULTIVITAMIN WITH MINERALS) TABS Take 1 tablet by mouth daily.  . Polyethylene Glycol 3350 (MIRALAX PO) Take by mouth.    Allergies:   Penicillins   Social History   Socioeconomic History  . Marital status: Married    Spouse name: Not on file  .  Number of children: Not on file  . Years of education: Not on file  . Highest education level: Not on file  Occupational History  . Not on file  Tobacco Use  . Smoking status: Never Smoker  . Smokeless tobacco: Never Used  Substance and Sexual Activity  . Alcohol use: Yes    Comment: 3 glasses of wine per week  . Drug use: No  . Sexual activity: Not on file  Other Topics Concern  . Not on file  Social History Narrative  . Not on file   Social Determinants of Health   Financial Resource Strain: Not on file  Food Insecurity: Not on file  Transportation Needs: Not on file  Physical Activity: Not on file  Stress: Not on file  Social Connections: Not on file     Family History:  The patient's family history includes Cancer in her  mother; Diabetes in an other family member.   ROS:   Please see the history of present illness.    ROS All other systems reviewed and are negative.  No flowsheet data found.     PHYSICAL EXAM:   VS:  BP 106/74   Pulse 74   Ht 5\' 2"  (1.575 m)   Wt 133 lb 12.8 oz (60.7 kg)   SpO2 97%   BMI 24.47 kg/m    GEN: Well nourished, well developed in no acute distress HEENT: Normal NECK: No JVD; No carotid bruits LYMPHATICS: No lymphadenopathy CARDIAC:RRR, no murmurs, rubs, gallops RESPIRATORY:  Clear to auscultation without rales, wheezing or rhonchi  ABDOMEN: Soft, non-tender, non-distended MUSCULOSKELETAL:  No edema; No deformity  SKIN: Warm and dry NEUROLOGIC:  Alert and oriented x 3 PSYCHIATRIC:  Normal affect    Wt Readings from Last 3 Encounters:  04/07/20 133 lb 12.8 oz (60.7 kg)  10/09/19 128 lb (58.1 kg)  07/26/19 132 lb (59.9 kg)      Studies/Labs Reviewed:   EKG:  EKG is not ordered today.   Recent Labs: 09/05/2019: BUN 13; Creatinine, Ser 1.00; Potassium 4.1; Sodium 141; TSH 1.950   Lipid Panel No results found for: CHOL, TRIG, HDL, CHOLHDL, VLDL, LDLCALC, LDLDIRECT  Additional studies/ records that were reviewed today include:  OV notes from PCP    ASSESSMENT:    1. Atrial tachycardia (HCC)   2. Atypical chest pain   3. COVID-19 virus infection   4. PAT (paroxysmal atrial tachycardia) (HCC)      PLAN:  In order of problems listed above:  1.  Paroxysmal atrial tachycardia -event monitor showed PACs and nonsustained atrial tach up to 10 beats at a time -may have been exacerbated by increased caffeine intake -2D echo normal -started on Toprol low dose with significant improvement in symptoms -encouraged her continue to limit her intake of caffeine  2.  Chest pain -this was very atypical and pinpoint under her left breast and very sharp and only occured when her HR got  > 150bpm with exercise but otherwise and can exercise with no  problems -this has resolved  3.  COVID 19 -this occurred in Dec 2020 -2D echo with normal LVF    Medication Adjustments/Labs and Tests Ordered: Current medicines are reviewed at length with the patient today.  Concerns regarding medicines are outlined above.  Medication changes, Labs and Tests ordered today are listed in the Patient Instructions below.  There are no Patient Instructions on file for this visit.   Signed, Jan 2021, MD  04/07/2020 4:01 PM  [facility] [place], [place], [place]  [address] Phone: [phone]; Fax: [phone]

## 2020-06-16 DIAGNOSIS — Z13220 Encounter for screening for lipoid disorders: Secondary | ICD-10-CM | POA: Diagnosis not present

## 2020-06-16 DIAGNOSIS — Z0000 Encounter for general adult medical examination without abnormal findings: Secondary | ICD-10-CM | POA: Diagnosis not present

## 2020-06-16 DIAGNOSIS — Z5181 Encounter for therapeutic drug level monitoring: Secondary | ICD-10-CM | POA: Diagnosis not present

## 2020-06-16 DIAGNOSIS — D696 Thrombocytopenia, unspecified: Secondary | ICD-10-CM | POA: Diagnosis not present

## 2020-07-14 DIAGNOSIS — Z01419 Encounter for gynecological examination (general) (routine) without abnormal findings: Secondary | ICD-10-CM | POA: Diagnosis not present

## 2020-07-14 DIAGNOSIS — Z1231 Encounter for screening mammogram for malignant neoplasm of breast: Secondary | ICD-10-CM | POA: Diagnosis not present

## 2020-07-14 DIAGNOSIS — Z6824 Body mass index (BMI) 24.0-24.9, adult: Secondary | ICD-10-CM | POA: Diagnosis not present

## 2020-09-15 ENCOUNTER — Other Ambulatory Visit: Payer: Self-pay | Admitting: Cardiology

## 2020-11-03 DIAGNOSIS — Z20822 Contact with and (suspected) exposure to covid-19: Secondary | ICD-10-CM | POA: Diagnosis not present

## 2021-03-10 DIAGNOSIS — L821 Other seborrheic keratosis: Secondary | ICD-10-CM | POA: Diagnosis not present

## 2021-03-10 DIAGNOSIS — D1801 Hemangioma of skin and subcutaneous tissue: Secondary | ICD-10-CM | POA: Diagnosis not present

## 2021-03-10 DIAGNOSIS — L814 Other melanin hyperpigmentation: Secondary | ICD-10-CM | POA: Diagnosis not present

## 2021-03-10 DIAGNOSIS — D2261 Melanocytic nevi of right upper limb, including shoulder: Secondary | ICD-10-CM | POA: Diagnosis not present

## 2021-05-21 ENCOUNTER — Encounter: Payer: Self-pay | Admitting: Cardiology

## 2021-05-21 ENCOUNTER — Ambulatory Visit: Payer: 59 | Admitting: Cardiology

## 2021-05-21 ENCOUNTER — Other Ambulatory Visit: Payer: Self-pay

## 2021-05-21 VITALS — BP 108/70 | HR 63 | Ht 62.0 in | Wt 140.4 lb

## 2021-05-21 DIAGNOSIS — R0789 Other chest pain: Secondary | ICD-10-CM | POA: Diagnosis not present

## 2021-05-21 DIAGNOSIS — I471 Supraventricular tachycardia: Secondary | ICD-10-CM | POA: Diagnosis not present

## 2021-05-21 MED ORDER — METOPROLOL SUCCINATE ER 25 MG PO TB24: ORAL_TABLET | ORAL | 3 refills | Status: DC

## 2021-05-21 NOTE — Patient Instructions (Signed)

## 2021-05-21 NOTE — Progress Notes (Signed)
Office Note   Date:  05/21/2021   ID:  Emer, Glandon 06-Sep-1975, MRN [id number]  PCP:  Carlos Levering, PA-C  Cardiologist:  Fransico Him, MD   Chief Complaint  Patient presents with   Follow-up    Nonsustained atrial tachycardia    History of Present Illness:  Tracey Peterson is a 46 y.o. female with a hx of anxiety, asthma, depression and  palpitations.  Event monitor showed nonsustained atrial tachycardia up to 10 beats at a time.   2D echo showed normal LVF and trivial MR and AR.  She was started on Toprol XL 12.5mg  daily   She is here today for followup and is doing well.  She denies any chest pain or pressure, SOB, DOE, PND, orthopnea, LE edema, dizziness or syncope.  She still has palpitations occasionally but only when she gets emotionally stressed and only last for about 30 seconds. She is compliant with her meds and is tolerating meds with no SE.     Past Medical History:  Diagnosis Date   Allergic rhinitis due to pollen    Anxiety    Asthma    Depression    Dermatitis    Eczema    History of COVID-19    Lung nodule    PAT (paroxysmal atrial tachycardia) (HCC)    Thrombocytopenia ([place])     Past Surgical History:  Procedure Laterality Date   LAPAROSCOPIC APPENDECTOMY N/A 10/03/2012   Procedure: APPENDECTOMY LAPAROSCOPIC;  Surgeon: Pedro Earls, MD;  Location: [facility];  Service: General;  Laterality: N/A;   NO PAST SURGERIES      Current Medications: Current Meds  Medication Sig   acetaminophen (TYLENOL) 500 MG tablet Take 1 tablet (500 mg total) by mouth every 6 (six) hours as needed for pain.   ALPRAZolam (XANAX) 0.5 MG tablet Take 0.5 mg by mouth 2 (two) times daily as needed for anxiety.   desogestrel-ethinyl estradiol (KARIVA,AZURETTE,MIRCETTE) 0.15-0.02/0.01 MG (21/5) tablet Take 1 tablet by mouth daily.   ibuprofen (ADVIL,MOTRIN) 200 MG tablet Take 600 mg by mouth every 8 (eight) hours as needed for pain.   levocetirizine (XYZAL) 5 MG tablet  Take 5 mg by mouth every evening.   metoprolol succinate (TOPROL-XL) 25 MG 24 hr tablet TAKE 1/2 TABLET(12.5 MG) BY MOUTH DAILY   montelukast (SINGULAIR) 10 MG tablet Take 10 mg by mouth at bedtime.   Multiple Vitamin (MULTIVITAMIN WITH MINERALS) TABS Take 1 tablet by mouth daily.   Polyethylene Glycol 3350 (MIRALAX PO) Take by mouth.    Allergies:   Penicillins   Social History   Socioeconomic History   Marital status: Married    Spouse name: Not on file   Number of children: Not on file   Years of education: Not on file   Highest education level: Not on file  Occupational History   Not on file  Tobacco Use   Smoking status: Never   Smokeless tobacco: Never  Substance and Sexual Activity   Alcohol use: Yes    Comment: 3 glasses of wine per week   Drug use: No   Sexual activity: Not on file  Other Topics Concern   Not on file  Social History Narrative   Not on file   Social Determinants of Health   Financial Resource Strain: Not on file  Food Insecurity: Not on file  Transportation Needs: Not on file  Physical Activity: Not on file  Stress: Not on file  Social Connections: Not on  file     Family History:  The patient's family history includes Cancer in her mother; Diabetes in an other family member.   ROS:   Please see the history of present illness.    ROS All other systems reviewed and are negative.  No flowsheet data found.     PHYSICAL EXAM:   VS:  BP 108/70 (BP Location: Left Arm, Patient Position: Sitting, Cuff Size: Normal)    Pulse 63    Ht 5\' 2"  (1.575 m)    Wt 140 lb 6.4 oz (63.7 kg)    SpO2 99%    BMI 25.68 kg/m    GEN: Well nourished, well developed in no acute distress HEENT: Normal NECK: No JVD; No carotid bruits LYMPHATICS: No lymphadenopathy CARDIAC:RRR, no murmurs, rubs, gallops RESPIRATORY:  Clear to auscultation without rales, wheezing or rhonchi  ABDOMEN: Soft, non-tender, non-distended MUSCULOSKELETAL:  No edema; No deformity  SKIN:  Warm and dry NEUROLOGIC:  Alert and oriented x 3 PSYCHIATRIC:  Normal affect I definitely think so and I think I probably done the same with my low back because I got some sciatic pain back especially when the cough still but its not really bad  Wt Readings from Last 3 Encounters:  05/21/21 140 lb 6.4 oz (63.7 kg)  04/07/20 133 lb 12.8 oz (60.7 kg)  10/09/19 128 lb (58.1 kg)      Studies/Labs Reviewed:   EKG:  EKG is ordered today and demonstrates NSR with no ST changes  Recent Labs: No results found for requested labs within last 8760 hours.   Lipid Panel No results found for: CHOL, TRIG, HDL, CHOLHDL, VLDL, LDLCALC, LDLDIRECT  Additional studies/ records that were reviewed today include:  OV notes from PCP    ASSESSMENT:    1. PAT (paroxysmal atrial tachycardia) ([place])   2. Atypical chest pain      PLAN:  In order of problems listed above:  1.  Paroxysmal atrial tachycardia -event monitor showed PACs and nonsustained atrial tach up to 10 beats at a time -may have been exacerbated by increased caffeine intake -2D echo normal -She still has some palpitations at times but not bothersome and only occur with emotional stress. -Continue prescription drug management with Toprol-XL 12.5 mg daily with as needed refills  2.  Chest pain -this was very atypical and pinpoint under her left breast and very sharp and only occured when her HR got  > 150bpm with exercise but otherwise and can exercise with no problems -this has resolved    Medication Adjustments/Labs and Tests Ordered: Current medicines are reviewed at length with the patient today.  Concerns regarding medicines are outlined above.  Medication changes, Labs and Tests ordered today are listed in the Patient Instructions below.  There are no Patient Instructions on file for this visit.   Signed, Fransico Him, MD  05/21/2021 8:41 AM    [address] [place], [place], [place]   [address] Phone: [phone]; Fax: [phone]

## 2021-05-21 NOTE — Addendum Note (Signed)
Addended by: Molli Barrows on: 05/21/2021 08:46 AM   Modules accepted: Orders

## 2021-06-08 DIAGNOSIS — R69 Illness, unspecified: Secondary | ICD-10-CM | POA: Diagnosis not present

## 2021-06-15 DIAGNOSIS — R69 Illness, unspecified: Secondary | ICD-10-CM | POA: Diagnosis not present

## 2021-06-22 DIAGNOSIS — R69 Illness, unspecified: Secondary | ICD-10-CM | POA: Diagnosis not present

## 2021-07-06 DIAGNOSIS — R69 Illness, unspecified: Secondary | ICD-10-CM | POA: Diagnosis not present

## 2021-07-20 DIAGNOSIS — R69 Illness, unspecified: Secondary | ICD-10-CM | POA: Diagnosis not present

## 2021-08-06 DIAGNOSIS — R69 Illness, unspecified: Secondary | ICD-10-CM | POA: Diagnosis not present

## 2021-09-01 DIAGNOSIS — R69 Illness, unspecified: Secondary | ICD-10-CM | POA: Diagnosis not present

## 2021-09-16 ENCOUNTER — Other Ambulatory Visit: Payer: Self-pay | Admitting: Cardiology

## 2021-09-23 DIAGNOSIS — M25531 Pain in right wrist: Secondary | ICD-10-CM | POA: Diagnosis not present

## 2021-10-06 DIAGNOSIS — R69 Illness, unspecified: Secondary | ICD-10-CM | POA: Diagnosis not present

## 2021-10-26 DIAGNOSIS — R69 Illness, unspecified: Secondary | ICD-10-CM | POA: Diagnosis not present

## 2021-11-10 DIAGNOSIS — R69 Illness, unspecified: Secondary | ICD-10-CM | POA: Diagnosis not present

## 2021-11-11 DIAGNOSIS — N3000 Acute cystitis without hematuria: Secondary | ICD-10-CM | POA: Diagnosis not present

## 2021-11-11 DIAGNOSIS — R300 Dysuria: Secondary | ICD-10-CM | POA: Diagnosis not present

## 2021-11-16 DIAGNOSIS — Z13228 Encounter for screening for other metabolic disorders: Secondary | ICD-10-CM | POA: Diagnosis not present

## 2021-11-16 DIAGNOSIS — R69 Illness, unspecified: Secondary | ICD-10-CM | POA: Diagnosis not present

## 2021-11-16 DIAGNOSIS — Z1159 Encounter for screening for other viral diseases: Secondary | ICD-10-CM | POA: Diagnosis not present

## 2021-11-16 DIAGNOSIS — J309 Allergic rhinitis, unspecified: Secondary | ICD-10-CM | POA: Diagnosis not present

## 2021-11-16 DIAGNOSIS — Z23 Encounter for immunization: Secondary | ICD-10-CM | POA: Diagnosis not present

## 2021-11-16 DIAGNOSIS — Z130 Encounter for screening for diseases of the blood and blood-forming organs and certain disorders involving the immune mechanism: Secondary | ICD-10-CM | POA: Diagnosis not present

## 2021-11-16 DIAGNOSIS — Z0000 Encounter for general adult medical examination without abnormal findings: Secondary | ICD-10-CM | POA: Diagnosis not present

## 2021-11-16 DIAGNOSIS — Z79899 Other long term (current) drug therapy: Secondary | ICD-10-CM | POA: Diagnosis not present

## 2021-11-16 DIAGNOSIS — Z1329 Encounter for screening for other suspected endocrine disorder: Secondary | ICD-10-CM | POA: Diagnosis not present

## 2021-11-16 DIAGNOSIS — Z13220 Encounter for screening for lipoid disorders: Secondary | ICD-10-CM | POA: Diagnosis not present

## 2021-12-10 DIAGNOSIS — Z6825 Body mass index (BMI) 25.0-25.9, adult: Secondary | ICD-10-CM | POA: Diagnosis not present

## 2021-12-10 DIAGNOSIS — Z1231 Encounter for screening mammogram for malignant neoplasm of breast: Secondary | ICD-10-CM | POA: Diagnosis not present

## 2021-12-10 DIAGNOSIS — Z01419 Encounter for gynecological examination (general) (routine) without abnormal findings: Secondary | ICD-10-CM | POA: Diagnosis not present

## 2022-03-21 DIAGNOSIS — J0110 Acute frontal sinusitis, unspecified: Secondary | ICD-10-CM | POA: Diagnosis not present

## 2022-07-12 ENCOUNTER — Other Ambulatory Visit: Payer: Self-pay | Admitting: Cardiology

## 2022-08-11 NOTE — Progress Notes (Unsigned)
Office Visit    Patient Name: Tracey Peterson Date of Encounter: 08/12/2022  PCP:  Carilyn Goodpasture, Cordelia Poche   [facility]  Cardiologist:  Armanda Magic, MD  Advanced Practice Provider:  No care team member to display Electrophysiologist:  None   HPI    Tracey Peterson is a 47 y.o. female with a past medical history of anxiety, depression, asthma, and palpitations presents today for follow-up visit.  Event monitor showed nonsustained atrial tachycardia up to 10 beats at a time.  2D echocardiogram showed normal LVEF with trivial MR and AR.  She was started on Toprol XL 12.5 mg daily.  She was last seen 2/23 and was doing well at that time.  Denied chest pain or pressure, SOB, DOE, PND, orthopnea, lower extremity edema, dizziness, or syncope.  She did endorse occasional palpitations but only when she gets emotionally stressed and only has them for about 30 seconds.  Compliant with medications and tolerating meds with no side effects.  Today, she states that she still has the palpitations but they are not a big deal.  They happen once or twice a week.  Usually very fleeting and lasts a couple of seconds.  She will cough and then they will go away.  No shortness of breath or chest pains.  No dizziness or lightheadedness.  No syncope or presyncope.  She states they are random and sometimes in the morning and sometimes in the evening.  Never noticed them with activity.  She is still able to do all the things she wants to do.  She exercises regularly with weight lifting and walking.  Will be getting updated lab work in August with her PCP.  Recommend CBC, BMP, TSH, and magnesium.  The palpitations do not seem to correlate with her menstrual cycle.  Reports no shortness of breath nor dyspnea on exertion. Reports no chest pain, pressure, or tightness. No edema, orthopnea, PND.   Past Medical History    Past Medical History:  Diagnosis Date   Allergic rhinitis due to pollen     Anxiety    Asthma    Depression    Dermatitis    Eczema    History of COVID-19    Lung nodule    PAT (paroxysmal atrial tachycardia)    Thrombocytopenia (HCC)    Past Surgical History:  Procedure Laterality Date   LAPAROSCOPIC APPENDECTOMY N/A 10/03/2012   Procedure: APPENDECTOMY LAPAROSCOPIC;  Surgeon: Valarie Merino, MD;  Location: [facility];  Service: General;  Laterality: N/A;   NO PAST SURGERIES      Allergies  Allergies  Allergen Reactions   Penicillins Hives    EKGs/Labs/Other Studies Reviewed:   The following studies were reviewed today: Cardiac Studies & Procedures       ECHOCARDIOGRAM  ECHOCARDIOGRAM COMPLETE 08/24/2019  Narrative ECHOCARDIOGRAM REPORT    Patient Name:   Tracey Peterson Date of Exam: 08/24/2019 Medical Rec #:  [id number]       Height:       62.0 in Accession #:    [id number]      Weight:       132.0 lb Date of Birth:  1975/07/29      BSA:          1.602 m Patient Age:    43 years        BP:           108/68 mmHg Patient Gender: F  HR:           72 bpm. Exam Location:  [address]  Procedure: 2D Echo, 3D Echo, Cardiac Doppler, Color Doppler and Strain Analysis  Indications:    R00.2 Palpitations  History:        Patient has no prior history of Echocardiogram examinations. COVID-19 ( December 2020).  Sonographer:    Sedonia Small Rodgers-Jones RDCS Referring Phys: [id number] TRACI R TURNER  IMPRESSIONS   1. Left ventricular ejection fraction, by estimation, is 60 to 65%. The left ventricle has normal function. The left ventricle has no regional wall motion abnormalities. Left ventricular diastolic parameters were normal. The average left ventricular global longitudinal strain is -23.3 %. 2. Right ventricular systolic function is normal. The right ventricular size is normal. Tricuspid regurgitation signal is inadequate for assessing PA pressure. 3. The mitral valve is normal in structure. Trivial mitral valve regurgitation. No  evidence of mitral stenosis. 4. The aortic valve is normal in structure. Aortic valve regurgitation is trivial. No aortic stenosis is present. 5. The inferior vena cava is normal in size with greater than 50% respiratory variability, suggesting right atrial pressure of 3 mmHg.  FINDINGS Left Ventricle: Left ventricular ejection fraction, by estimation, is 60 to 65%. The left ventricle has normal function. The left ventricle has no regional wall motion abnormalities. The average left ventricular global longitudinal strain is -23.3 %. The left ventricular internal cavity size was normal in size. There is no left ventricular hypertrophy. Left ventricular diastolic parameters were normal. Normal left ventricular filling pressure.  Right Ventricle: The right ventricular size is normal. No increase in right ventricular wall thickness. Right ventricular systolic function is normal. Tricuspid regurgitation signal is inadequate for assessing PA pressure.  Left Atrium: Left atrial size was normal in size.  Right Atrium: Right atrial size was normal in size.  Pericardium: There is no evidence of pericardial effusion.  Mitral Valve: The mitral valve is normal in structure. Normal mobility of the mitral valve leaflets. Trivial mitral valve regurgitation. No evidence of mitral valve stenosis.  Tricuspid Valve: The tricuspid valve is normal in structure. Tricuspid valve regurgitation is not demonstrated. No evidence of tricuspid stenosis.  Aortic Valve: The aortic valve is normal in structure. Aortic valve regurgitation is trivial. No aortic stenosis is present.  Pulmonic Valve: The pulmonic valve was normal in structure. Pulmonic valve regurgitation is not visualized. No evidence of pulmonic stenosis.  Aorta: The aortic root is normal in size and structure.  Venous: The inferior vena cava is normal in size with greater than 50% respiratory variability, suggesting right atrial pressure of 3  mmHg.  IAS/Shunts: No atrial level shunt detected by color flow Doppler.   LEFT VENTRICLE PLAX 2D LVIDd:         4.30 cm  Diastology LVIDs:         2.30 cm  LV e' lateral:   16.90 cm/s LV PW:         0.90 cm  LV E/e' lateral: 5.4 LV IVS:        0.50 cm  LV e' medial:    11.40 cm/s LVOT diam:     1.90 cm  LV E/e' medial:  8.1 LV SV:         51 LV SV Index:   32       2D Longitudinal Strain LVOT Area:     2.84 cm 2D Strain GLS (A2C):   -23.3 % 2D Strain GLS (A3C):   -23.0 % 2D  Strain GLS (A4C):   -23.6 % 2D Strain GLS Avg:     -23.3 %  3D Volume EF: 3D EF:        66 % LV EDV:       105 ml LV ESV:       36 ml LV SV:        70 ml  RIGHT VENTRICLE RV Basal diam:  3.40 cm RV S prime:     17.80 cm/s TAPSE (M-mode): 2.5 cm  LEFT ATRIUM             Index       RIGHT ATRIUM           Index LA diam:        3.40 cm 2.12 cm/m  RA Area:     10.00 cm LA Vol (A2C):   34.7 ml 21.66 ml/m RA Volume:   24.00 ml  14.98 ml/m LA Vol (A4C):   22.9 ml 14.29 ml/m LA Biplane Vol: 29.1 ml 18.16 ml/m AORTIC VALVE LVOT Vmax:   77.30 cm/s LVOT Vmean:  57.050 cm/s LVOT VTI:    0.178 m  AORTA Ao Root diam: 3.60 cm Ao Asc diam:  3.20 cm  MITRAL VALVE MV Area (PHT): 3.21 cm    SHUNTS MV Decel Time: 236 msec    Systemic VTI:  0.18 m MV E velocity: 91.90 cm/s  Systemic Diam: 1.90 cm MV A velocity: 50.30 cm/s MV E/A ratio:  1.83  Mihai Croitoru MD Electronically signed by Thurmon Fair MD Signature Date/Time: 08/24/2019/1:02:19 PM    Final    MONITORS  LONG TERM MONITOR (3-14 DAYS) 09/02/2019  Narrative  Sinus bradycardia, normal sinus rhythm, sinus tachycardia. The average heart rate was 71bpm and ranged from 46 to 145bpm.  PACs and nonsustained atrial tachycardia up to 12 beats.  Rare PVC.            EKG:  EKG is  ordered today.  The ekg ordered today demonstrates normal sinus rhythm, rate 63 bpm.  Recent Labs: No results found for requested labs within last 365 days.   Recent Lipid Panel No results found for: "CHOL", "TRIG", "HDL", "CHOLHDL", "VLDL", "LDLCALC", "LDLDIRECT"  Home Medications   Current Meds  Medication Sig   ALPRAZolam (XANAX) 0.5 MG tablet Take 0.5 mg by mouth 2 (two) times daily as needed for anxiety.   desogestrel-ethinyl estradiol (KARIVA,AZURETTE,MIRCETTE) 0.15-0.02/0.01 MG (21/5) tablet Take 1 tablet by mouth daily.   ibuprofen (ADVIL,MOTRIN) 200 MG tablet Take 600 mg by mouth every 8 (eight) hours as needed for pain.   levocetirizine (XYZAL) 5 MG tablet Take 5 mg by mouth every evening.   metoprolol succinate (TOPROL-XL) 25 MG 24 hr tablet TAKE 1/2 TABLET(12.5 MG) BY MOUTH DAILY   montelukast (SINGULAIR) 10 MG tablet Take 10 mg by mouth at bedtime.   Multiple Vitamin (MULTIVITAMIN WITH MINERALS) TABS Take 1 tablet by mouth daily.   Polyethylene Glycol 3350 (MIRALAX PO) Take by mouth.     Review of Systems      All other systems reviewed and are otherwise negative except as noted above.  Physical Exam    VS:  BP 98/60   Pulse 66   Ht 5\' 2"  (1.575 m)   Wt 134 lb 3.2 oz (60.9 kg)   SpO2 98%   BMI 24.55 kg/m  , BMI Body mass index is 24.55 kg/m.  Wt Readings from Last 3 Encounters:  08/12/22 134 lb 3.2 oz (60.9 kg)  05/21/21  140 lb 6.4 oz (63.7 kg)  04/07/20 133 lb 12.8 oz (60.7 kg)     GEN: Well nourished, well developed, in no acute distress. HEENT: normal. Neck: Supple, no JVD, carotid bruits, or masses. Cardiac: RRR, no murmurs, rubs, or gallops. No clubbing, cyanosis, edema.  Radials/PT 2+ and equal bilaterally.  Respiratory:  Respirations regular and unlabored, clear to auscultation bilaterally. GI: Soft, nontender, nondistended. MS: No deformity or atrophy. Skin: Warm and dry, no rash. Neuro:  Strength and sensation are intact. Psych: Normal affect.  Assessment & Plan    Paroxysmal atrial tachycardia -She is tolerating 12.5 mg of metoprolol succinate -She still has episodes but they are fleeting.   Happens a few times a week. -Overall, well-controlled -Her palpitations do not limit her activity level -Unfortunately, we cannot titrate her metoprolol today because blood pressure is low and heart rate is 66 bpm. -Discussed continuing proper hydration with her high level of activity -Discussed continuing to limit caffeine and alcohol       Disposition: Follow up 1 year with Armanda Magic, MD or APP.  Signed, Sharlene Dory, PA-C 08/12/2022, 8:36 AM [facility]

## 2022-08-12 ENCOUNTER — Encounter: Payer: Self-pay | Admitting: Physician Assistant

## 2022-08-12 ENCOUNTER — Ambulatory Visit: Payer: 59 | Attending: Physician Assistant | Admitting: Physician Assistant

## 2022-08-12 VITALS — BP 98/60 | HR 66 | Ht 62.0 in | Wt 134.2 lb

## 2022-08-12 DIAGNOSIS — I4719 Other supraventricular tachycardia: Secondary | ICD-10-CM | POA: Diagnosis not present

## 2022-08-12 DIAGNOSIS — R0789 Other chest pain: Secondary | ICD-10-CM

## 2022-08-12 MED ORDER — METOPROLOL SUCCINATE ER 25 MG PO TB24: 25.0000 mg | ORAL_TABLET | Freq: Every day | ORAL | 3 refills | Status: DC

## 2022-08-12 NOTE — Patient Instructions (Signed)
Medication Instructions:   Your physician recommends that you continue on your current medications as directed. Please refer to the Current Medication list given to you today.  *If you need a refill on your cardiac medications before your next appointment, please call your pharmacy*   Lab Work:  MAKE SURE YOU GET BMET MAG CBC AND TSH WITH PRIMARY PROVIDER IN [place]  If you have labs (blood work) drawn today and your tests are completely normal, you will receive your results only by: MyChart Message (if you have MyChart) OR A paper copy in the mail If you have any lab test that is abnormal or we need to change your treatment, we will call you to review the results.   Testing/Procedures: NONE ORDERED  TODAY    Follow-Up: At [facility], you and your health needs are our priority.  As part of our continuing mission to provide you with exceptional heart care, we have created designated Provider Care Teams.  These Care Teams include your primary Cardiologist (physician) and Advanced Practice Providers (APPs -  Physician Assistants and Nurse Practitioners) who all work together to provide you with the care you need, when you need it.  We recommend signing up for the patient portal called "MyChart".  Sign up information is provided on this After Visit Summary.  MyChart is used to connect with patients for Virtual Visits (Telemedicine).  Patients are able to view lab/test results, encounter notes, upcoming appointments, etc.  Non-urgent messages can be sent to your provider as well.   To learn more about what you can do with MyChart, go to [email or web address].    Your next appointment:   1 year(s)  Provider:   Armanda Magic, MD     Other Instructions

## 2022-09-23 DIAGNOSIS — Z7952 Long term (current) use of systemic steroids: Secondary | ICD-10-CM | POA: Diagnosis not present

## 2022-09-23 DIAGNOSIS — L309 Dermatitis, unspecified: Secondary | ICD-10-CM | POA: Diagnosis not present

## 2022-11-19 DIAGNOSIS — D696 Thrombocytopenia, unspecified: Secondary | ICD-10-CM | POA: Diagnosis not present

## 2022-11-19 DIAGNOSIS — Z0000 Encounter for general adult medical examination without abnormal findings: Secondary | ICD-10-CM | POA: Diagnosis not present

## 2022-11-19 DIAGNOSIS — I4719 Other supraventricular tachycardia: Secondary | ICD-10-CM | POA: Diagnosis not present

## 2022-11-19 DIAGNOSIS — Z13220 Encounter for screening for lipoid disorders: Secondary | ICD-10-CM | POA: Diagnosis not present

## 2022-11-19 DIAGNOSIS — Z1211 Encounter for screening for malignant neoplasm of colon: Secondary | ICD-10-CM | POA: Diagnosis not present

## 2022-12-15 DIAGNOSIS — Z6824 Body mass index (BMI) 24.0-24.9, adult: Secondary | ICD-10-CM | POA: Diagnosis not present

## 2022-12-15 DIAGNOSIS — Z01419 Encounter for gynecological examination (general) (routine) without abnormal findings: Secondary | ICD-10-CM | POA: Diagnosis not present

## 2022-12-15 DIAGNOSIS — Z1231 Encounter for screening mammogram for malignant neoplasm of breast: Secondary | ICD-10-CM | POA: Diagnosis not present

## 2022-12-29 DIAGNOSIS — Z01419 Encounter for gynecological examination (general) (routine) without abnormal findings: Secondary | ICD-10-CM | POA: Diagnosis not present

## 2023-03-22 ENCOUNTER — Other Ambulatory Visit: Payer: Self-pay | Admitting: Physician Assistant

## 2023-03-22 DIAGNOSIS — Z1211 Encounter for screening for malignant neoplasm of colon: Secondary | ICD-10-CM | POA: Diagnosis not present

## 2023-03-23 DIAGNOSIS — R0981 Nasal congestion: Secondary | ICD-10-CM | POA: Diagnosis not present

## 2023-03-23 DIAGNOSIS — J0100 Acute maxillary sinusitis, unspecified: Secondary | ICD-10-CM | POA: Diagnosis not present

## 2023-04-26 DIAGNOSIS — L821 Other seborrheic keratosis: Secondary | ICD-10-CM | POA: Diagnosis not present

## 2023-04-26 DIAGNOSIS — D225 Melanocytic nevi of trunk: Secondary | ICD-10-CM | POA: Diagnosis not present

## 2023-04-26 DIAGNOSIS — D1801 Hemangioma of skin and subcutaneous tissue: Secondary | ICD-10-CM | POA: Diagnosis not present

## 2023-04-26 DIAGNOSIS — L570 Actinic keratosis: Secondary | ICD-10-CM | POA: Diagnosis not present

## 2023-04-26 DIAGNOSIS — L814 Other melanin hyperpigmentation: Secondary | ICD-10-CM | POA: Diagnosis not present

## 2023-08-09 ENCOUNTER — Encounter: Payer: Self-pay | Admitting: Cardiology

## 2023-08-17 ENCOUNTER — Ambulatory Visit: Attending: Cardiology | Admitting: Cardiology

## 2023-08-17 ENCOUNTER — Encounter: Payer: Self-pay | Admitting: Cardiology

## 2023-08-17 VITALS — BP 112/72 | HR 58 | Ht 62.0 in | Wt 139.2 lb

## 2023-08-17 DIAGNOSIS — I340 Nonrheumatic mitral (valve) insufficiency: Secondary | ICD-10-CM | POA: Diagnosis not present

## 2023-08-17 DIAGNOSIS — I4719 Other supraventricular tachycardia: Secondary | ICD-10-CM | POA: Diagnosis not present

## 2023-08-17 DIAGNOSIS — R0789 Other chest pain: Secondary | ICD-10-CM

## 2023-08-17 DIAGNOSIS — I351 Nonrheumatic aortic (valve) insufficiency: Secondary | ICD-10-CM | POA: Diagnosis not present

## 2023-08-17 NOTE — Progress Notes (Signed)
 Office Note   Date:  08/17/2023   ID:  Tracey Peterson, Tracey Peterson 1976-04-03, MRN [id number]  PCP:  Robie Cho, PA-C  Cardiologist:  Gaylyn Keas, MD   Chief Complaint  Patient presents with   Follow-up    Atrial tachycardia    History of Present Illness:  Tracey Peterson is a 48 y.o. female with a hx of anxiety, asthma, depression and  palpitations.  Event monitor showed nonsustained atrial tachycardia up to 10 beats at a time.   2D echo showed normal LVF and trivial MR and AR.  She was started on Toprol  XL 12.5mg  daily   sHe is here today for followup and is doing well.  SHe denies any chest pain or pressure, SOB, DOE, PND, orthopnea, LE edema, dizziness, or syncope.  She still has palpitations lasting 2 seconds a few times a week but not bothersome. She is compliant with her meds and is tolerating meds with no SE.     Past Medical History:  Diagnosis Date   Allergic rhinitis due to pollen    Anxiety    Asthma    Depression    Dermatitis    Eczema    History of COVID-19    Lung nodule    PAT (paroxysmal atrial tachycardia) (HCC)    Thrombocytopenia (HCC)     Past Surgical History:  Procedure Laterality Date   LAPAROSCOPIC APPENDECTOMY N/A 10/03/2012   Procedure: APPENDECTOMY LAPAROSCOPIC;  Surgeon: Azucena Bollard, MD;  Location: [facility];  Service: General;  Laterality: N/A;   NO PAST SURGERIES      Current Medications: Current Meds  Medication Sig   ALPRAZolam (XANAX) 0.5 MG tablet Take 0.5 mg by mouth 2 (two) times daily as needed for anxiety.   desogestrel -ethinyl estradiol  (VIORELE) 0.15-0.02/0.01 MG (21/5) tablet TAKE 1 TABLET BY MOUTH EVERY DAY   ibuprofen (ADVIL,MOTRIN) 200 MG tablet Take 600 mg by mouth every 8 (eight) hours as needed for pain.   levocetirizine (XYZAL ) 5 MG tablet Take 5 mg by mouth every evening.   metoprolol  succinate (TOPROL -XL) 25 MG 24 hr tablet TAKE 1 TABLET(25 MG) BY MOUTH DAILY   montelukast  (SINGULAIR ) 10 MG tablet Take 10 mg by  mouth at bedtime.   Multiple Vitamin (MULTIVITAMIN WITH MINERALS) TABS Take 1 tablet by mouth daily.   Polyethylene Glycol 3350 (MIRALAX PO) Take by mouth.    Allergies:   Penicillins   Social History   Socioeconomic History   Marital status: Married    Spouse name: Not on file   Number of children: Not on file   Years of education: Not on file   Highest education level: Not on file  Occupational History   Not on file  Tobacco Use   Smoking status: Never   Smokeless tobacco: Never  Substance and Sexual Activity   Alcohol use: Yes    Comment: 3 glasses of wine per week   Drug use: No   Sexual activity: Not on file  Other Topics Concern   Not on file  Social History Narrative   Not on file   Social Drivers of Health   Financial Resource Strain: Not on file  Food Insecurity: Not on file  Transportation Needs: Not on file  Physical Activity: Not on file  Stress: Not on file  Social Connections: Not on file     Family History:  The patient's family history includes Cancer in her mother; Diabetes in an other family member.   ROS:  Please see the history of present illness.    ROS All other systems reviewed and are negative.      No data to display             PHYSICAL EXAM:   VS:  BP 112/72   Pulse (!) 58   Ht 5\' 2"  (1.575 m)   Wt 139 lb 3.2 oz (63.1 kg)   SpO2 98%   BMI 25.46 kg/m    GEN: Well nourished, well developed in no acute distress HEENT: Normal NECK: No JVD; No carotid bruits LYMPHATICS: No lymphadenopathy CARDIAC:RRR, no murmurs, rubs, gallops RESPIRATORY:  Clear to auscultation without rales, wheezing or rhonchi  ABDOMEN: Soft, non-tender, non-distended MUSCULOSKELETAL:  No edema; No deformity  SKIN: Warm and dry NEUROLOGIC:  Alert and oriented x 3 PSYCHIATRIC:  Normal affect  Wt Readings from Last 3 Encounters:  08/17/23 139 lb 3.2 oz (63.1 kg)  08/12/22 134 lb 3.2 oz (60.9 kg)  05/21/21 140 lb 6.4 oz (63.7 kg)       Studies/Labs Reviewed:   EKG Interpretation Date/Time:  Wednesday Aug 17 2023 10:23:18 EDT Ventricular Rate:  58 PR Interval:  132 QRS Duration:  74 QT Interval:  414 QTC Calculation: 406 R Axis:   75  Text Interpretation: Sinus bradycardia Confirmed by Gaylyn Keas ([id number]) on 08/17/2023 10:37:47 AM     Recent Labs: No results found for requested labs within last 365 days.   Lipid Panel No results found for: "CHOL", "TRIG", "HDL", "CHOLHDL", "VLDL", "LDLCALC", "LDLDIRECT"  Additional studies/ records that were reviewed today include:  OV notes from PCP    ASSESSMENT:    1. PAT (paroxysmal atrial tachycardia) (HCC)   2. Other chest pain      PLAN:  In order of problems listed above:  1.  Paroxysmal atrial tachycardia -event monitor showed PACs and nonsustained atrial tach up to 10 beats at a time -may have been exacerbated by increased caffeine intake -2D echo normal -She has not had any further palpitations since I saw her last -Continue Toprol  XL 25 mg daily with as needed refills  2.  Chest pain -this was very atypical and pinpoint under her left breast and very sharp and only occured when her HR got > 150bpm with exercise but otherwise and can exercise with no problems - She has not had any further chest pain  3.  AI/MR -trivial AI and MR in 2021 -repeat echo in 1 year  Followup with me in 1 year    Medication Adjustments/Labs and Tests Ordered: Current medicines are reviewed at length with the patient today.  Concerns regarding medicines are outlined above.  Medication changes, Labs and Tests ordered today are listed in the Patient Instructions below.  There are no Patient Instructions on file for this visit.   Signed, Gaylyn Keas, MD  08/17/2023 10:38 AM    [facility] [address] [place], [place], [place]  [address] Phone: [phone]; Fax: [phone]

## 2023-08-17 NOTE — Addendum Note (Signed)
 Addended by: Darinda [place] N on: 08/17/2023 10:46 AM   Modules accepted: Orders

## 2023-08-17 NOTE — Patient Instructions (Signed)
 Medication Instructions:  Your physician recommends that you continue on your current medications as directed. Please refer to the Current Medication list given to you today.  *If you need a refill on your cardiac medications before your next appointment, please call your pharmacy*  Lab Work: NONE If you have labs (blood work) drawn today and your tests are completely normal, you will receive your results only by: MyChart Message (if you have MyChart) OR A paper copy in the mail If you have any lab test that is abnormal or we need to change your treatment, we will call you to review the results.  Testing/Procedures: Echo in 1 year  Follow-Up: At [facility], you and your health needs are our priority.  As part of our continuing mission to provide you with exceptional heart care, our providers are all part of one team.  This team includes your primary Cardiologist (physician) and Advanced Practice Providers or APPs (Physician Assistants and Nurse Practitioners) who all work together to provide you with the care you need, when you need it.  Your next appointment:   1 year(s)  Provider:   Gaylyn Keas, MD    We recommend signing up for the patient portal called "MyChart".  Sign up information is provided on this After Visit Summary.  MyChart is used to connect with patients for Virtual Visits (Telemedicine).  Patients are able to view lab/test results, encounter notes, upcoming appointments, etc.  Non-urgent messages can be sent to your provider as well.   To learn more about what you can do with MyChart, go to [email or web address].   Other Instructions

## 2024-08-14 ENCOUNTER — Other Ambulatory Visit (HOSPITAL_COMMUNITY)
